# Patient Record
Sex: Male | Born: 1945
Health system: Southern US, Community
[De-identification: ages and names within clinical notes are randomized; demographics above are authoritative.]

---

## 2003-09-07 ENCOUNTER — Encounter: Admission: RE | Admit: 2003-09-07 | Discharge: 2003-09-07 | Payer: Self-pay

## 2003-10-04 ENCOUNTER — Encounter: Admission: RE | Admit: 2003-10-04 | Discharge: 2003-10-04 | Payer: Self-pay

## 2003-10-24 ENCOUNTER — Encounter: Admission: RE | Admit: 2003-10-24 | Discharge: 2003-10-24 | Payer: Self-pay

## 2003-11-11 ENCOUNTER — Encounter: Admission: RE | Admit: 2003-11-11 | Discharge: 2003-11-11 | Payer: Self-pay

## 2004-11-01 ENCOUNTER — Encounter: Admission: RE | Admit: 2004-11-01 | Discharge: 2004-11-01 | Payer: Self-pay | Admitting: Internal Medicine

## 2004-12-21 ENCOUNTER — Encounter: Admission: RE | Admit: 2004-12-21 | Discharge: 2004-12-21 | Payer: Self-pay | Admitting: Internal Medicine

## 2006-05-16 ENCOUNTER — Encounter (INDEPENDENT_AMBULATORY_CARE_PROVIDER_SITE_OTHER): Payer: Self-pay | Admitting: *Deleted

## 2006-05-16 ENCOUNTER — Ambulatory Visit (HOSPITAL_COMMUNITY): Admission: RE | Admit: 2006-05-16 | Discharge: 2006-05-16 | Payer: Self-pay | Admitting: *Deleted

## 2007-02-13 ENCOUNTER — Encounter: Admission: RE | Admit: 2007-02-13 | Discharge: 2007-02-13 | Payer: Self-pay | Admitting: Internal Medicine

## 2010-10-19 NOTE — Op Note (Signed)
NAMEAROLDO, GALLI NO.:  [id number]   MEDICAL RECORD NO.:  [id number]          PATIENT TYPE:  AMB   LOCATION:  ENDO                         FACILITY:  [facility]   PHYSICIAN:  Georgiana Spinner, M.D.    DATE OF BIRTH:  [birth year]   DATE OF PROCEDURE:  DATE OF DISCHARGE:                               OPERATIVE REPORT   PROCEDURE:  Upper endoscopy.  The patient has GERD.   ANESTHESIA:  Fentanyl 75 mcg, Versed 8 mg.   PROCEDURE:  With the patient mildly sedated in the left lateral  decubitus position, the Pentax videoscopic endoscope was inserted in the  mouth and passed under direct vision through the esophagus which  appeared normal.  There was no evidence of Barrett's esophagus.  We  entered into the stomach.  The fundus, body, antrum appeared normal,  except for the prepyloric area which appeared mildly erythematous and  was photographed and biopsied.  We entered into the duodenal bulb and  second portion of the duodenum.  Both appeared normal.  From this point,  the endoscope was slowly withdrawn, taking circumferential views of  duodenal mucosa until the endoscope had been pulled back into the  stomach and placed in retroflexion, viewing the stomach from below.  The  endoscope was straightened and withdrawn, taking circumferential views  of the remaining gastric and esophageal mucosa.  The patient's vital  signs and pulse oximeter remained stable.  The patient tolerated the  procedure well without apparent complications.   FINDINGS:  Erythema of gastric antrum, biopsied.  Await biopsy report.  The patient will call me for results and follow up with me as an  outpatient.  Proceed to colonoscopy.           ______________________________  Georgiana Spinner, M.D.     GMO/MEDQ  D:  05/16/2006  T:  05/16/2006  Job:  [id number]

## 2010-10-19 NOTE — Op Note (Signed)
John Ali, CRIGER NO.:  [id number]   MEDICAL RECORD NO.:  [id number]          PATIENT TYPE:  AMB   LOCATION:  ENDO                         FACILITY:  [facility]   PHYSICIAN:  Georgiana Spinner, M.D.    DATE OF BIRTH:  February 06, 1946   DATE OF PROCEDURE:  05/16/2006  DATE OF DISCHARGE:                               OPERATIVE REPORT   PROCEDURE:  Colonoscopy.   INDICATIONS:  Colon cancer screening.   ANESTHESIA:  Fentanyl 50 mcg, Versed 2 mg   PROCEDURE:  With the patient mildly sedated in the left lateral  decubitus position the Pentax videoscopic colonoscope was inserted into  the rectum after a rectal exam was attempted, passed under direct vision  to the cecum, identified by ileocecal valve and appendiceal orifice both  which were photographed.  From this point the colonoscope was slowly  withdrawn, taking circumferential views of colonic mucosa, stopping only  in the rectum which appeared normal on direct, showed hemorrhoids on  retroflexed view.  The endoscope was straightened and withdrawn.  The  patient's vital signs, pulse oximeter remained stable.  The patient  tolerated procedure well without apparent complications.   FINDINGS:  Internal hemorrhoids, otherwise unremarkable examination.   PLAN:  Consider repeat examination in 5-10 years.           ______________________________  Georgiana Spinner, M.D.     GMO/MEDQ  D:  05/16/2006  T:  05/16/2006  Job:  [id number]

## 2014-06-16 DIAGNOSIS — F5101 Primary insomnia: Secondary | ICD-10-CM | POA: Diagnosis not present

## 2014-06-16 DIAGNOSIS — E1165 Type 2 diabetes mellitus with hyperglycemia: Secondary | ICD-10-CM | POA: Diagnosis not present

## 2014-06-16 DIAGNOSIS — E039 Hypothyroidism, unspecified: Secondary | ICD-10-CM | POA: Diagnosis not present

## 2014-06-16 DIAGNOSIS — I10 Essential (primary) hypertension: Secondary | ICD-10-CM | POA: Diagnosis not present

## 2014-09-20 DIAGNOSIS — D72829 Elevated white blood cell count, unspecified: Secondary | ICD-10-CM | POA: Diagnosis not present

## 2014-09-20 DIAGNOSIS — E119 Type 2 diabetes mellitus without complications: Secondary | ICD-10-CM | POA: Diagnosis not present

## 2014-09-20 DIAGNOSIS — Z125 Encounter for screening for malignant neoplasm of prostate: Secondary | ICD-10-CM | POA: Diagnosis not present

## 2014-09-26 DIAGNOSIS — E1165 Type 2 diabetes mellitus with hyperglycemia: Secondary | ICD-10-CM | POA: Diagnosis not present

## 2014-09-26 DIAGNOSIS — E039 Hypothyroidism, unspecified: Secondary | ICD-10-CM | POA: Diagnosis not present

## 2014-09-26 DIAGNOSIS — Z23 Encounter for immunization: Secondary | ICD-10-CM | POA: Diagnosis not present

## 2014-09-26 DIAGNOSIS — Z0000 Encounter for general adult medical examination without abnormal findings: Secondary | ICD-10-CM | POA: Diagnosis not present

## 2014-09-26 DIAGNOSIS — E780 Pure hypercholesterolemia: Secondary | ICD-10-CM | POA: Diagnosis not present

## 2015-01-18 DIAGNOSIS — Z794 Long term (current) use of insulin: Secondary | ICD-10-CM | POA: Diagnosis not present

## 2015-01-18 DIAGNOSIS — E1165 Type 2 diabetes mellitus with hyperglycemia: Secondary | ICD-10-CM | POA: Diagnosis not present

## 2015-01-18 DIAGNOSIS — I10 Essential (primary) hypertension: Secondary | ICD-10-CM | POA: Diagnosis not present

## 2015-03-08 DIAGNOSIS — I10 Essential (primary) hypertension: Secondary | ICD-10-CM | POA: Diagnosis not present

## 2015-03-08 DIAGNOSIS — E1165 Type 2 diabetes mellitus with hyperglycemia: Secondary | ICD-10-CM | POA: Diagnosis not present

## 2015-03-17 DIAGNOSIS — E1165 Type 2 diabetes mellitus with hyperglycemia: Secondary | ICD-10-CM | POA: Diagnosis not present

## 2015-03-17 DIAGNOSIS — E782 Mixed hyperlipidemia: Secondary | ICD-10-CM | POA: Diagnosis not present

## 2015-03-17 DIAGNOSIS — Z794 Long term (current) use of insulin: Secondary | ICD-10-CM | POA: Diagnosis not present

## 2015-03-17 DIAGNOSIS — I10 Essential (primary) hypertension: Secondary | ICD-10-CM | POA: Diagnosis not present

## 2015-03-17 DIAGNOSIS — Z23 Encounter for immunization: Secondary | ICD-10-CM | POA: Diagnosis not present

## 2015-04-21 DIAGNOSIS — M5136 Other intervertebral disc degeneration, lumbar region: Secondary | ICD-10-CM | POA: Diagnosis not present

## 2015-04-21 DIAGNOSIS — M545 Low back pain: Secondary | ICD-10-CM | POA: Diagnosis not present

## 2015-04-21 DIAGNOSIS — R52 Pain, unspecified: Secondary | ICD-10-CM | POA: Diagnosis not present

## 2015-04-21 DIAGNOSIS — R51 Headache: Secondary | ICD-10-CM | POA: Diagnosis not present

## 2015-04-21 DIAGNOSIS — M62838 Other muscle spasm: Secondary | ICD-10-CM | POA: Diagnosis not present

## 2015-04-21 DIAGNOSIS — M47817 Spondylosis without myelopathy or radiculopathy, lumbosacral region: Secondary | ICD-10-CM | POA: Diagnosis not present

## 2015-04-21 DIAGNOSIS — M5137 Other intervertebral disc degeneration, lumbosacral region: Secondary | ICD-10-CM | POA: Diagnosis not present

## 2015-04-25 DIAGNOSIS — E1165 Type 2 diabetes mellitus with hyperglycemia: Secondary | ICD-10-CM | POA: Diagnosis not present

## 2015-04-25 DIAGNOSIS — E782 Mixed hyperlipidemia: Secondary | ICD-10-CM | POA: Diagnosis not present

## 2015-04-25 DIAGNOSIS — Z794 Long term (current) use of insulin: Secondary | ICD-10-CM | POA: Diagnosis not present

## 2015-06-07 DIAGNOSIS — J209 Acute bronchitis, unspecified: Secondary | ICD-10-CM | POA: Diagnosis not present

## 2015-06-07 DIAGNOSIS — J0110 Acute frontal sinusitis, unspecified: Secondary | ICD-10-CM | POA: Diagnosis not present

## 2015-06-07 DIAGNOSIS — I10 Essential (primary) hypertension: Secondary | ICD-10-CM | POA: Diagnosis not present

## 2015-06-27 DIAGNOSIS — E782 Mixed hyperlipidemia: Secondary | ICD-10-CM | POA: Diagnosis not present

## 2015-06-27 DIAGNOSIS — E1165 Type 2 diabetes mellitus with hyperglycemia: Secondary | ICD-10-CM | POA: Diagnosis not present

## 2015-06-27 DIAGNOSIS — Z794 Long term (current) use of insulin: Secondary | ICD-10-CM | POA: Diagnosis not present

## 2015-07-04 DIAGNOSIS — E1165 Type 2 diabetes mellitus with hyperglycemia: Secondary | ICD-10-CM | POA: Diagnosis not present

## 2015-07-04 DIAGNOSIS — I10 Essential (primary) hypertension: Secondary | ICD-10-CM | POA: Diagnosis not present

## 2015-07-04 DIAGNOSIS — E782 Mixed hyperlipidemia: Secondary | ICD-10-CM | POA: Diagnosis not present

## 2015-07-04 DIAGNOSIS — J069 Acute upper respiratory infection, unspecified: Secondary | ICD-10-CM | POA: Diagnosis not present

## 2015-07-10 DIAGNOSIS — M5416 Radiculopathy, lumbar region: Secondary | ICD-10-CM | POA: Diagnosis not present

## 2015-07-10 DIAGNOSIS — M545 Low back pain: Secondary | ICD-10-CM | POA: Diagnosis not present

## 2015-07-10 DIAGNOSIS — M1288 Other specific arthropathies, not elsewhere classified, other specified site: Secondary | ICD-10-CM | POA: Diagnosis not present

## 2015-07-24 DIAGNOSIS — M1288 Other specific arthropathies, not elsewhere classified, other specified site: Secondary | ICD-10-CM | POA: Diagnosis not present

## 2015-07-24 DIAGNOSIS — M5440 Lumbago with sciatica, unspecified side: Secondary | ICD-10-CM | POA: Diagnosis not present

## 2015-07-24 DIAGNOSIS — M5416 Radiculopathy, lumbar region: Secondary | ICD-10-CM | POA: Diagnosis not present

## 2015-07-29 DIAGNOSIS — R1110 Vomiting, unspecified: Secondary | ICD-10-CM | POA: Diagnosis not present

## 2015-07-29 DIAGNOSIS — R072 Precordial pain: Secondary | ICD-10-CM | POA: Diagnosis not present

## 2015-07-29 DIAGNOSIS — R079 Chest pain, unspecified: Secondary | ICD-10-CM | POA: Diagnosis not present

## 2015-07-30 DIAGNOSIS — R072 Precordial pain: Secondary | ICD-10-CM | POA: Diagnosis not present

## 2015-07-30 DIAGNOSIS — I10 Essential (primary) hypertension: Secondary | ICD-10-CM | POA: Diagnosis not present

## 2015-07-30 DIAGNOSIS — R079 Chest pain, unspecified: Secondary | ICD-10-CM | POA: Diagnosis not present

## 2015-07-30 DIAGNOSIS — E1165 Type 2 diabetes mellitus with hyperglycemia: Secondary | ICD-10-CM | POA: Diagnosis not present

## 2015-07-31 DIAGNOSIS — I10 Essential (primary) hypertension: Secondary | ICD-10-CM | POA: Diagnosis not present

## 2015-07-31 DIAGNOSIS — R079 Chest pain, unspecified: Secondary | ICD-10-CM | POA: Diagnosis not present

## 2015-07-31 DIAGNOSIS — E119 Type 2 diabetes mellitus without complications: Secondary | ICD-10-CM | POA: Diagnosis not present

## 2015-07-31 DIAGNOSIS — R911 Solitary pulmonary nodule: Secondary | ICD-10-CM | POA: Diagnosis not present

## 2015-07-31 DIAGNOSIS — R072 Precordial pain: Secondary | ICD-10-CM | POA: Diagnosis not present

## 2015-08-01 DIAGNOSIS — I10 Essential (primary) hypertension: Secondary | ICD-10-CM | POA: Diagnosis not present

## 2015-08-01 DIAGNOSIS — I25118 Atherosclerotic heart disease of native coronary artery with other forms of angina pectoris: Secondary | ICD-10-CM | POA: Diagnosis not present

## 2015-08-01 DIAGNOSIS — E119 Type 2 diabetes mellitus without complications: Secondary | ICD-10-CM | POA: Diagnosis not present

## 2015-08-01 DIAGNOSIS — I501 Left ventricular failure: Secondary | ICD-10-CM | POA: Diagnosis not present

## 2015-08-01 DIAGNOSIS — I6523 Occlusion and stenosis of bilateral carotid arteries: Secondary | ICD-10-CM | POA: Diagnosis not present

## 2015-08-01 DIAGNOSIS — R072 Precordial pain: Secondary | ICD-10-CM | POA: Diagnosis not present

## 2015-08-01 DIAGNOSIS — R079 Chest pain, unspecified: Secondary | ICD-10-CM | POA: Diagnosis not present

## 2015-08-01 DIAGNOSIS — R570 Cardiogenic shock: Secondary | ICD-10-CM | POA: Diagnosis not present

## 2015-08-01 DIAGNOSIS — I25119 Atherosclerotic heart disease of native coronary artery with unspecified angina pectoris: Secondary | ICD-10-CM | POA: Diagnosis not present

## 2015-08-02 DIAGNOSIS — I517 Cardiomegaly: Secondary | ICD-10-CM | POA: Diagnosis not present

## 2015-08-02 DIAGNOSIS — I491 Atrial premature depolarization: Secondary | ICD-10-CM | POA: Diagnosis not present

## 2015-08-02 DIAGNOSIS — R072 Precordial pain: Secondary | ICD-10-CM | POA: Diagnosis not present

## 2015-08-03 DIAGNOSIS — I4581 Long QT syndrome: Secondary | ICD-10-CM | POA: Diagnosis not present

## 2015-08-04 DIAGNOSIS — I447 Left bundle-branch block, unspecified: Secondary | ICD-10-CM | POA: Diagnosis not present

## 2015-08-05 DIAGNOSIS — R072 Precordial pain: Secondary | ICD-10-CM | POA: Diagnosis not present

## 2015-08-06 DIAGNOSIS — R072 Precordial pain: Secondary | ICD-10-CM | POA: Diagnosis not present

## 2015-08-07 DIAGNOSIS — R072 Precordial pain: Secondary | ICD-10-CM | POA: Diagnosis not present

## 2016-04-09 DIAGNOSIS — I2510 Atherosclerotic heart disease of native coronary artery without angina pectoris: Secondary | ICD-10-CM | POA: Diagnosis not present

## 2016-04-09 DIAGNOSIS — E119 Type 2 diabetes mellitus without complications: Secondary | ICD-10-CM | POA: Diagnosis not present

## 2016-04-09 DIAGNOSIS — E1169 Type 2 diabetes mellitus with other specified complication: Secondary | ICD-10-CM | POA: Diagnosis not present

## 2016-04-09 DIAGNOSIS — I252 Old myocardial infarction: Secondary | ICD-10-CM | POA: Diagnosis not present

## 2016-04-09 DIAGNOSIS — I10 Essential (primary) hypertension: Secondary | ICD-10-CM | POA: Diagnosis not present

## 2016-04-24 DIAGNOSIS — R072 Precordial pain: Secondary | ICD-10-CM | POA: Diagnosis not present

## 2016-04-24 DIAGNOSIS — I2510 Atherosclerotic heart disease of native coronary artery without angina pectoris: Secondary | ICD-10-CM | POA: Diagnosis not present

## 2016-04-30 DIAGNOSIS — I252 Old myocardial infarction: Secondary | ICD-10-CM | POA: Diagnosis not present

## 2016-04-30 DIAGNOSIS — I2510 Atherosclerotic heart disease of native coronary artery without angina pectoris: Secondary | ICD-10-CM | POA: Diagnosis not present

## 2016-04-30 DIAGNOSIS — E1169 Type 2 diabetes mellitus with other specified complication: Secondary | ICD-10-CM | POA: Diagnosis not present

## 2016-04-30 DIAGNOSIS — I209 Angina pectoris, unspecified: Secondary | ICD-10-CM | POA: Diagnosis not present

## 2016-04-30 DIAGNOSIS — I10 Essential (primary) hypertension: Secondary | ICD-10-CM | POA: Diagnosis not present

## 2016-06-21 DIAGNOSIS — E1165 Type 2 diabetes mellitus with hyperglycemia: Secondary | ICD-10-CM | POA: Diagnosis not present

## 2016-06-21 DIAGNOSIS — I2510 Atherosclerotic heart disease of native coronary artery without angina pectoris: Secondary | ICD-10-CM | POA: Diagnosis not present

## 2016-06-21 DIAGNOSIS — N400 Enlarged prostate without lower urinary tract symptoms: Secondary | ICD-10-CM | POA: Diagnosis not present

## 2016-06-27 DIAGNOSIS — E039 Hypothyroidism, unspecified: Secondary | ICD-10-CM | POA: Diagnosis not present

## 2016-06-27 DIAGNOSIS — I2510 Atherosclerotic heart disease of native coronary artery without angina pectoris: Secondary | ICD-10-CM | POA: Diagnosis not present

## 2016-06-27 DIAGNOSIS — E782 Mixed hyperlipidemia: Secondary | ICD-10-CM | POA: Diagnosis not present

## 2016-06-27 DIAGNOSIS — E1165 Type 2 diabetes mellitus with hyperglycemia: Secondary | ICD-10-CM | POA: Diagnosis not present

## 2016-06-27 DIAGNOSIS — Z23 Encounter for immunization: Secondary | ICD-10-CM | POA: Diagnosis not present

## 2016-10-03 DIAGNOSIS — I2510 Atherosclerotic heart disease of native coronary artery without angina pectoris: Secondary | ICD-10-CM | POA: Diagnosis not present

## 2016-10-03 DIAGNOSIS — E1165 Type 2 diabetes mellitus with hyperglycemia: Secondary | ICD-10-CM | POA: Diagnosis not present

## 2016-10-10 DIAGNOSIS — I2510 Atherosclerotic heart disease of native coronary artery without angina pectoris: Secondary | ICD-10-CM | POA: Diagnosis not present

## 2016-10-10 DIAGNOSIS — E782 Mixed hyperlipidemia: Secondary | ICD-10-CM | POA: Diagnosis not present

## 2016-10-10 DIAGNOSIS — E1165 Type 2 diabetes mellitus with hyperglycemia: Secondary | ICD-10-CM | POA: Diagnosis not present

## 2016-10-10 DIAGNOSIS — E039 Hypothyroidism, unspecified: Secondary | ICD-10-CM | POA: Diagnosis not present

## 2016-10-15 DIAGNOSIS — E119 Type 2 diabetes mellitus without complications: Secondary | ICD-10-CM | POA: Diagnosis not present

## 2016-10-15 DIAGNOSIS — M7989 Other specified soft tissue disorders: Secondary | ICD-10-CM | POA: Diagnosis not present

## 2016-10-15 DIAGNOSIS — G4733 Obstructive sleep apnea (adult) (pediatric): Secondary | ICD-10-CM | POA: Diagnosis not present

## 2016-10-15 DIAGNOSIS — Z01818 Encounter for other preprocedural examination: Secondary | ICD-10-CM | POA: Diagnosis not present

## 2016-10-15 DIAGNOSIS — I2510 Atherosclerotic heart disease of native coronary artery without angina pectoris: Secondary | ICD-10-CM | POA: Diagnosis not present

## 2016-10-15 DIAGNOSIS — E785 Hyperlipidemia, unspecified: Secondary | ICD-10-CM | POA: Diagnosis not present

## 2016-10-15 DIAGNOSIS — I10 Essential (primary) hypertension: Secondary | ICD-10-CM | POA: Diagnosis not present

## 2016-10-15 DIAGNOSIS — I25119 Atherosclerotic heart disease of native coronary artery with unspecified angina pectoris: Secondary | ICD-10-CM | POA: Diagnosis not present

## 2016-10-15 DIAGNOSIS — I252 Old myocardial infarction: Secondary | ICD-10-CM | POA: Diagnosis not present

## 2016-10-15 DIAGNOSIS — I209 Angina pectoris, unspecified: Secondary | ICD-10-CM | POA: Diagnosis not present

## 2016-10-16 DIAGNOSIS — I2510 Atherosclerotic heart disease of native coronary artery without angina pectoris: Secondary | ICD-10-CM | POA: Diagnosis not present

## 2016-10-16 DIAGNOSIS — M7989 Other specified soft tissue disorders: Secondary | ICD-10-CM | POA: Diagnosis not present

## 2016-10-23 DIAGNOSIS — I10 Essential (primary) hypertension: Secondary | ICD-10-CM | POA: Diagnosis not present

## 2016-10-23 DIAGNOSIS — R9439 Abnormal result of other cardiovascular function study: Secondary | ICD-10-CM | POA: Diagnosis not present

## 2016-10-23 DIAGNOSIS — G8929 Other chronic pain: Secondary | ICD-10-CM | POA: Diagnosis not present

## 2016-10-23 DIAGNOSIS — E785 Hyperlipidemia, unspecified: Secondary | ICD-10-CM | POA: Diagnosis not present

## 2016-10-23 DIAGNOSIS — R911 Solitary pulmonary nodule: Secondary | ICD-10-CM | POA: Diagnosis not present

## 2016-10-23 DIAGNOSIS — Z7984 Long term (current) use of oral hypoglycemic drugs: Secondary | ICD-10-CM | POA: Diagnosis not present

## 2016-10-23 DIAGNOSIS — E119 Type 2 diabetes mellitus without complications: Secondary | ICD-10-CM | POA: Diagnosis not present

## 2016-10-23 DIAGNOSIS — I2510 Atherosclerotic heart disease of native coronary artery without angina pectoris: Secondary | ICD-10-CM | POA: Diagnosis not present

## 2016-10-23 DIAGNOSIS — M545 Low back pain: Secondary | ICD-10-CM | POA: Diagnosis not present

## 2016-10-23 DIAGNOSIS — R072 Precordial pain: Secondary | ICD-10-CM | POA: Diagnosis not present

## 2016-10-23 DIAGNOSIS — Z9889 Other specified postprocedural states: Secondary | ICD-10-CM | POA: Diagnosis not present

## 2016-10-23 DIAGNOSIS — G4733 Obstructive sleep apnea (adult) (pediatric): Secondary | ICD-10-CM | POA: Diagnosis not present

## 2016-10-23 DIAGNOSIS — R600 Localized edema: Secondary | ICD-10-CM | POA: Diagnosis not present

## 2016-10-23 DIAGNOSIS — Z955 Presence of coronary angioplasty implant and graft: Secondary | ICD-10-CM | POA: Diagnosis not present

## 2016-10-23 DIAGNOSIS — Z7982 Long term (current) use of aspirin: Secondary | ICD-10-CM | POA: Diagnosis not present

## 2016-10-23 DIAGNOSIS — Z981 Arthrodesis status: Secondary | ICD-10-CM | POA: Diagnosis not present

## 2016-10-23 DIAGNOSIS — Z79899 Other long term (current) drug therapy: Secondary | ICD-10-CM | POA: Diagnosis not present

## 2016-10-23 DIAGNOSIS — I252 Old myocardial infarction: Secondary | ICD-10-CM | POA: Diagnosis not present

## 2017-01-10 DIAGNOSIS — R911 Solitary pulmonary nodule: Secondary | ICD-10-CM | POA: Diagnosis not present

## 2017-01-10 DIAGNOSIS — I252 Old myocardial infarction: Secondary | ICD-10-CM | POA: Diagnosis not present

## 2017-01-10 DIAGNOSIS — I2510 Atherosclerotic heart disease of native coronary artery without angina pectoris: Secondary | ICD-10-CM | POA: Diagnosis not present

## 2017-01-10 DIAGNOSIS — E119 Type 2 diabetes mellitus without complications: Secondary | ICD-10-CM | POA: Diagnosis not present

## 2017-01-10 DIAGNOSIS — I10 Essential (primary) hypertension: Secondary | ICD-10-CM | POA: Diagnosis not present

## 2017-02-14 DIAGNOSIS — E039 Hypothyroidism, unspecified: Secondary | ICD-10-CM | POA: Diagnosis not present

## 2017-02-14 DIAGNOSIS — I10 Essential (primary) hypertension: Secondary | ICD-10-CM | POA: Diagnosis not present

## 2017-02-14 DIAGNOSIS — Z125 Encounter for screening for malignant neoplasm of prostate: Secondary | ICD-10-CM | POA: Diagnosis not present

## 2017-02-14 DIAGNOSIS — E782 Mixed hyperlipidemia: Secondary | ICD-10-CM | POA: Diagnosis not present

## 2017-02-14 DIAGNOSIS — E1165 Type 2 diabetes mellitus with hyperglycemia: Secondary | ICD-10-CM | POA: Diagnosis not present

## 2017-02-14 DIAGNOSIS — Z0000 Encounter for general adult medical examination without abnormal findings: Secondary | ICD-10-CM | POA: Diagnosis not present

## 2017-02-21 DIAGNOSIS — I2510 Atherosclerotic heart disease of native coronary artery without angina pectoris: Secondary | ICD-10-CM | POA: Diagnosis not present

## 2017-02-21 DIAGNOSIS — E782 Mixed hyperlipidemia: Secondary | ICD-10-CM | POA: Diagnosis not present

## 2017-02-21 DIAGNOSIS — E039 Hypothyroidism, unspecified: Secondary | ICD-10-CM | POA: Diagnosis not present

## 2017-02-21 DIAGNOSIS — Z23 Encounter for immunization: Secondary | ICD-10-CM | POA: Diagnosis not present

## 2017-02-21 DIAGNOSIS — E1165 Type 2 diabetes mellitus with hyperglycemia: Secondary | ICD-10-CM | POA: Diagnosis not present

## 2017-02-21 DIAGNOSIS — M545 Low back pain: Secondary | ICD-10-CM | POA: Diagnosis not present

## 2017-04-04 ENCOUNTER — Other Ambulatory Visit: Payer: Self-pay | Admitting: Emergency Medicine

## 2017-04-04 ENCOUNTER — Ambulatory Visit (INDEPENDENT_AMBULATORY_CARE_PROVIDER_SITE_OTHER): Payer: Medicare Other

## 2017-04-04 DIAGNOSIS — M25522 Pain in left elbow: Secondary | ICD-10-CM

## 2017-04-04 DIAGNOSIS — R52 Pain, unspecified: Secondary | ICD-10-CM

## 2017-04-04 DIAGNOSIS — X58XXXA Exposure to other specified factors, initial encounter: Secondary | ICD-10-CM

## 2017-04-04 DIAGNOSIS — M7989 Other specified soft tissue disorders: Secondary | ICD-10-CM

## 2017-04-04 DIAGNOSIS — S89302A Unspecified physeal fracture of lower end of left fibula, initial encounter for closed fracture: Secondary | ICD-10-CM

## 2017-04-08 ENCOUNTER — Ambulatory Visit (INDEPENDENT_AMBULATORY_CARE_PROVIDER_SITE_OTHER): Payer: Medicare Other

## 2017-04-08 ENCOUNTER — Ambulatory Visit (INDEPENDENT_AMBULATORY_CARE_PROVIDER_SITE_OTHER): Payer: Medicare Other | Admitting: Family Medicine

## 2017-04-08 DIAGNOSIS — S82892A Other fracture of left lower leg, initial encounter for closed fracture: Secondary | ICD-10-CM

## 2017-04-08 DIAGNOSIS — S82832D Other fracture of upper and lower end of left fibula, subsequent encounter for closed fracture with routine healing: Secondary | ICD-10-CM

## 2017-04-08 DIAGNOSIS — X58XXXD Exposure to other specified factors, subsequent encounter: Secondary | ICD-10-CM

## 2017-04-08 MED ORDER — OXYCODONE-ACETAMINOPHEN 5-325 MG PO TABS: 1.0000 | ORAL_TABLET | Freq: Four times a day (QID) | ORAL | 0 refills | Status: AC | PRN

## 2017-04-08 MED ORDER — AMBULATORY NON FORMULARY MEDICATION: 0 refills | Status: AC

## 2017-04-08 NOTE — Patient Instructions (Addendum)
Thank you for coming in today. Recheck in 2 weeks.  Return sooner if needed.  Keep the weight off the leg if able.  Sleep in the boot.    Ankle Fracture A fracture is a break in a bone. A cast or splint may be used to protect the ankle and heal the break. Sometimes, surgery is needed. Follow these instructions at home:  Use crutches as told by your doctor. It is very important that you use your crutches correctly.  Do not put weight or pressure on the injured ankle until told by your doctor.  Keep your ankle raised (elevated) when sitting or lying down.  Apply ice to the ankle: ? Put ice in a plastic bag. ? Place a towel between your cast and the bag. ? Leave the ice on for 20 minutes, 2-3 times a day.  If you have a plaster or fiberglass cast: ? Do not try to scratch under the cast with any objects. ? Check the skin around the cast every day. You may put lotion on red or sore areas. ? Keep your cast dry and clean.  If you have a plaster splint: ? Wear the splint as told by your doctor. ? You can loosen the elastic around the splint if your toes get numb, tingle, or turn cold or blue.  Do not put pressure on any part of your cast or splint. It may break. Rest your plaster splint or cast only on a pillow the first 24 hours until it is fully hardened.  Cover your cast or splint with a plastic bag during showers.  Do not lower your cast or splint into water.  Take medicine as told by your doctor.  Do not drive until your doctor says it is safe.  Follow-up with your doctor as told. It is very important that you go to your follow-up visits. Contact a doctor if: The swelling and discomfort gets worse. Get help right away if:  Your splint or cast breaks.  You continue to have very bad pain.  You have new pain or swelling after your splint or cast was put on.  Your skin or toes below the injured ankle: ? Turn blue or gray. ? Feel cold, numb, or you cannot feel  them.  There is a bad smell or yellowish white fluid (pus) coming from under the splint or cast. This information is not intended to replace advice given to you by your health care provider. Make sure you discuss any questions you have with your health care provider. Document Released: 03/17/2009 Document Revised: 10/26/2015 Document Reviewed: 12/17/2012 Elsevier Interactive Patient Education  2017 [employer].

## 2017-04-08 NOTE — Progress Notes (Signed)
Subjective:    I'm seeing this patient as a consultation for:  Dr. Burnett Harry  CC: Non-displaced left distal fibular fracture  HPI: Patient with history of left distal leg crush injury on 11/1 after work-place accident. Patient seen initially by Dr. Burnett Harry on 11/2 for severe left ankle and knee pain. Xrays notable for left nondisplaced distal fibular fracture. No other fractures identified at that time.  Imaging summarized as: nondisplaced fracture of lateral malleolus of left fibula. The mortiseis was noted to be intact. No other acute changes were noted on imaging.   The patient was placed initially in a soft, boot walking cast. Patient has since been placing weight on foot. Patient also prescribed vicodin PRN q6h for pain.  Today, patient endorses significant pain in knee as well as lateral ankle. Pain is worse with walking. Patient has been attempting to walk with fracture, but significantly limited due to pain. His knee pain is located at medial joint line and quadriceps tendon. Patient states he has lateral knee pain with weight bearing. Ankle pain located over fracture site worse with weight baring. Pain radiates up lateral leg to knee with walking. Patient does state that knee is significantly swollen today. Notes no erythema or skin changes. He has not had follow-up imaging of ankle.  Past medical history, Surgical history, Family history not pertinant except as noted below, Social history, Allergies, and medications have been entered into the medical record, reviewed, and no changes needed.   Review of Systems: No headache, visual changes, nausea, vomiting, diarrhea, constipation, dizziness, abdominal pain, skin rash, fevers, chills, night sweats, weight loss, swollen lymph nodes, body aches, joint swelling, muscle aches, chest pain, shortness of breath, mood changes, visual or auditory hallucinations.   Objective:    Vitals:   04/08/17 1047  BP: (!) 147/77  Pulse: 69   General:  Well Developed, well nourished, and in no acute distress.  Neuro/Psych: Alert and oriented x3, extra-ocular muscles intact, able to move all 4 extremities, sensation grossly intact. Skin: Warm and dry, no rashes noted.  Respiratory: Not using accessory muscles, speaking in full sentences, trachea midline.  Cardiovascular: Pulses palpable, no extremity edema. Abdomen: Does not appear distended. MSK:  L. Ankle: Minimally swollen with no overlying erythema Significantly tender to palpation over lateral aspect Ankle grossly intact with preserved sensation Plantar/dorsiflexion intact  Pulses and capillary refill and sensation are intact distally.   L. Knee: Minimally swollen with no overlying erythema Tenderness along medial joint line and quadriceps tendon Appropriate range of motion on passive/active flexion though limited due to LE pain  Left Ankle Xray 11/6: Left ankle xray shows nondisplaced distal fibular fracture.  No concern for displacement since initial imaging.  The mortiseis was noted to be intact. Awaiting further radiologist review.  No results found for this or any previous visit (from the past 24 hour(s)). No results found.  Impression and Recommendations:    Assessment and Plan: 71 y.o. male with nondisplaced distal fibular fracture. Follow-up Xray imaging today shows unchanged fracture of distal fibula without displacement.   The patient does not require surgical fixation or orthopedic referral at this time as fracture is nondisplaced. We discussed option of soft boot and hard casting, and patient elected for management with a soft boot. This is reasonable given xray findings. Patient was counseled to avoid placing weight on left ankle for at least 2-4 weeks. Boot provided on initial presentation was too large, so provided new boot today. Placed order for walker today so  patient can minimize weight baring.   Patient also continues to struggle with pain control.  Prescribed 5-day course of oxycodone to be taken on as needed basis.   Patient will not be able to work for next 2-4 weeks, and will have to minimize weight baring after that point for an additional 4-8 weeks.  He will follow up in clinic for follow up and repeat left ankle xray in 2 weeks.   Orders Placed This Encounter  Procedures  . DG Ankle Complete Left    Standing Status:   Future    Number of Occurrences:   1    Standing Expiration Date:   06/08/2018    Order Specific Question:   Reason for Exam (SYMPTOM  OR DIAGNOSIS REQUIRED)    Answer:   eval fracture. Displacing?    Order Specific Question:   Preferred imaging location?    Answer:   [facility]    Order Specific Question:   Radiology Contrast Protocol - do NOT remove file path    Answer:   [email or web address]   Meds ordered this encounter  Medications  . RANEXA 500 MG 12 hr tablet  . ranolazine (RANEXA) 500 MG 12 hr tablet    Sig: Take 500 mg by mouth.  . prasugrel (EFFIENT) 10 MG TABS tablet  . prasugrel (EFFIENT) 10 MG TABS tablet    Sig: TAKE ONE TABLET BY MOUTH ONCE DAILY  . metFORMIN (GLUCOPHAGE) 1000 MG tablet  . metoprolol tartrate (LOPRESSOR) 25 MG tablet    Sig: TAKE 1 TABLET BY MOUTH TWICE DAILY  . LEVEMIR FLEXTOUCH 100 UNIT/ML Pen  . atorvastatin (LIPITOR) 80 MG tablet  . AMBULATORY NON FORMULARY MEDICATION    Sig: Standard Walker for use with ankle fracture.  S82.892A    Dispense:  1 each    Refill:  0  . oxyCODONE-acetaminophen (PERCOCET/ROXICET) 5-325 MG tablet    Sig: Take 1 tablet every 6 (six) hours as needed by mouth.    Dispense:  15 tablet    Refill:  0    Discussed warning signs or symptoms. Please see discharge instructions. Patient expresses understanding.

## 2017-04-22 ENCOUNTER — Encounter: Payer: Self-pay | Admitting: Family Medicine

## 2017-04-22 ENCOUNTER — Ambulatory Visit (INDEPENDENT_AMBULATORY_CARE_PROVIDER_SITE_OTHER): Payer: Medicare Other

## 2017-04-22 ENCOUNTER — Ambulatory Visit (INDEPENDENT_AMBULATORY_CARE_PROVIDER_SITE_OTHER): Payer: Medicare Other | Admitting: Family Medicine

## 2017-04-22 VITALS — BP 155/58 | HR 60 | Temp 98.2°F | Resp 16 | Wt 271.1 lb

## 2017-04-22 DIAGNOSIS — S82892A Other fracture of left lower leg, initial encounter for closed fracture: Secondary | ICD-10-CM

## 2017-04-22 DIAGNOSIS — X58XXXD Exposure to other specified factors, subsequent encounter: Secondary | ICD-10-CM

## 2017-04-22 DIAGNOSIS — S82832A Other fracture of upper and lower end of left fibula, initial encounter for closed fracture: Secondary | ICD-10-CM | POA: Diagnosis not present

## 2017-04-22 NOTE — Patient Instructions (Signed)
Thank you for coming in today. Recheck in 2 weeks.  Continue non-weight bearing  Get a job description and list of duties sent to me so I can figure out if you can return to work.  Continue the cast boot.

## 2017-04-22 NOTE — Progress Notes (Signed)
John Ali is a 71 y.o. male who presents to [facility] today for follow up of left nondisplaced distal fibular fracture.  Patient with history of left distal leg crush injury on 11/1 after work-place accident. Patient seen initially by Dr. Burnett Harry on 11/2 for severe left ankle and knee pain. Xrays notable for left nondisplaced distal fibular fracture. No other fractures identified at that time.  Patient has been in soft cast for last two weeks, using walker to ambulate. He states pain is overall well managed. Pain not exacerbated by walking (with aid of walker) around house. Pain is worst however in the morning. States that he wake up with increased swelling and a throbbing sensation. Responds well to percocet. Has been using percocet on limited basis, less than once a day as needed.   History reviewed. No pertinent past medical history. History reviewed. No pertinent surgical history. Social History   Tobacco Use  . Smoking status: Never Smoker  . Smokeless tobacco: Never Used  Substance Use Topics  . Alcohol use: No    Frequency: Never     ROS:  As above   Medications: Current Outpatient Medications  Medication Sig Dispense Refill  . AMBULATORY NON FORMULARY MEDICATION Standard Walker for use with ankle fracture.  U27.253G 1 each 0  . atorvastatin (LIPITOR) 80 MG tablet     . LEVEMIR FLEXTOUCH 100 UNIT/ML Pen     . metFORMIN (GLUCOPHAGE) 1000 MG tablet     . metoprolol tartrate (LOPRESSOR) 25 MG tablet TAKE 1 TABLET BY MOUTH TWICE DAILY    . oxyCODONE-acetaminophen (PERCOCET/ROXICET) 5-325 MG tablet Take 1 tablet every 6 (six) hours as needed by mouth. 15 tablet 0  . prasugrel (EFFIENT) 10 MG TABS tablet TAKE ONE TABLET BY MOUTH ONCE DAILY    . ranolazine (RANEXA) 500 MG 12 hr tablet Take 500 mg by mouth.     No current facility-administered medications for this visit.    No Known Allergies   Exam:  BP (!) 155/58    Pulse 60   Temp 98.2 F (36.8 C) (Oral)   Resp 16   Wt 271 lb 1.9 oz (123 kg)   SpO2 97%  General: Well Developed, well nourished, and in no acute distress.  Neuro/Psych: Alert and oriented x3, extra-ocular muscles intact, able to move all 4 extremities, sensation grossly intact. Skin: Warm and dry, no rashes noted.  Respiratory: Not using accessory muscles, speaking in full sentences, trachea midline.  Cardiovascular: Pulses palpable, no extremity edema. Abdomen: Does not appear distended. MSK:  L. Ankle: Minimally swollen with no overlying erythema Ankle grossly intact with preserved sensation Plantar/dorsiflexion intact and without pain.  Left ankle Xray 11/20:  Shows nondisplaced distal fibular fracture. Stable from prior xray. No evidence of displacement from prior xray on 11/6. Awaiting further radiologist review.     Assessment and Plan: 71 y.o. male with nondisplaced distal fibular fracture, sustained 11/1. Patient should continue to use soft walking boot and walker. In observing his gait, he appears to place approximately 50% of his weight over leg. This is not ideal, but he is without pain and Xrays are stable. He will benefit from continued use of boot with walker. Counseled to minimize weight baring as much as possible.  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 Asked for job description from employer to inform my recommendation. It is likely that he will be able to return to work on limited basis, driving only (no lifting,  getting in and out of car) in the next several weeks.    Pain is well controlled with minimal percocet requirement.   Patient should return to follow up in 2 weeks.   Orders Placed This Encounter  Procedures  . DG Ankle Complete Left    Standing Status:   Future    Number of Occurrences:   1    Standing Expiration Date:   06/22/2018    Order Specific Question:   Reason for Exam (SYMPTOM  OR DIAGNOSIS REQUIRED)    Answer:   f/u fx    Order  Specific Question:   Preferred imaging location?    Answer:   [facility]    Order Specific Question:   Radiology Contrast Protocol - do NOT remove file path    Answer:   [email or web address]   No orders of the defined types were placed in this encounter.   Discussed warning signs or symptoms. Please see discharge instructions. Patient expresses understanding.

## 2017-05-06 ENCOUNTER — Ambulatory Visit (INDEPENDENT_AMBULATORY_CARE_PROVIDER_SITE_OTHER): Payer: Medicare Other

## 2017-05-06 ENCOUNTER — Encounter: Payer: Self-pay | Admitting: Family Medicine

## 2017-05-06 ENCOUNTER — Ambulatory Visit (INDEPENDENT_AMBULATORY_CARE_PROVIDER_SITE_OTHER): Payer: Medicare Other | Admitting: Family Medicine

## 2017-05-06 VITALS — BP 166/84 | HR 61 | Ht 66.0 in | Wt 278.0 lb

## 2017-05-06 DIAGNOSIS — S82892A Other fracture of left lower leg, initial encounter for closed fracture: Secondary | ICD-10-CM | POA: Diagnosis not present

## 2017-05-06 DIAGNOSIS — S82832G Other fracture of upper and lower end of left fibula, subsequent encounter for closed fracture with delayed healing: Secondary | ICD-10-CM

## 2017-05-06 DIAGNOSIS — X58XXXD Exposure to other specified factors, subsequent encounter: Secondary | ICD-10-CM

## 2017-05-06 DIAGNOSIS — S82832A Other fracture of upper and lower end of left fibula, initial encounter for closed fracture: Secondary | ICD-10-CM | POA: Diagnosis not present

## 2017-05-06 NOTE — Progress Notes (Signed)
John Ali is a 71 y.o. male who presents to [facility] today for follow up of left nondisplaced distal fibular fracture.  Patient with history of left distal leg crush injury on 11/1 after work-place accident. Patient seen initially by Dr. Burnett Harry on 11/2 for severe left ankle and knee pain. Initial xrays notable for left nondisplaced distal fibular fracture. No other fractures identified at that time.  Patient has been in cam walker for last four weeks, using walker to ambulate. Does walk without walker around house on occasion. States that he is wearing boot at all times, including sleep. Only takes it off to shower, but does not have pain when transferring in and out of shower. No pain to the touch, per patient.   No past medical history on file. No past surgical history on file. Social History   Tobacco Use  . Smoking status: Never Smoker  . Smokeless tobacco: Never Used  Substance Use Topics  . Alcohol use: No    Frequency: Never    ROS:  As above   Medications: Current Outpatient Medications  Medication Sig Dispense Refill  . AMBULATORY NON FORMULARY MEDICATION Standard Walker for use with ankle fracture.  X32.355D 1 each 0  . atorvastatin (LIPITOR) 80 MG tablet     . LEVEMIR FLEXTOUCH 100 UNIT/ML Pen     . metFORMIN (GLUCOPHAGE) 1000 MG tablet     . metoprolol tartrate (LOPRESSOR) 25 MG tablet TAKE 1 TABLET BY MOUTH TWICE DAILY    . oxyCODONE-acetaminophen (PERCOCET/ROXICET) 5-325 MG tablet Take 1 tablet every 6 (six) hours as needed by mouth. 15 tablet 0  . prasugrel (EFFIENT) 10 MG TABS tablet TAKE ONE TABLET BY MOUTH ONCE DAILY    . ranolazine (RANEXA) 500 MG 12 hr tablet Take 500 mg by mouth.     No current facility-administered medications for this visit.    No Known Allergies   Exam:  BP (!) 166/84   Pulse 61   Ht 5\' 6"  (1.676 m)   Wt 278 lb (126.1 kg)   BMI 44.87 kg/m  General: Well Developed, well  nourished, and in no acute distress.  Neuro/Psych: Alert and oriented x3, extra-ocular muscles intact, able to move all 4 extremities, sensation grossly intact. Skin: Warm and dry, no rashes noted.  Respiratory: Not using accessory muscles, speaking in full sentences, trachea midline.  Cardiovascular: Pulses palpable, no extremity edema. Abdomen: Does not appear distended. MSK:  L. Ankle: Nonswollen with no overlying erythema Ankle grossly intact with preserved sensation Plantar/dorsiflexion intact and without pain. Minimal pain to palpation over lateral ankle.   Left ankle Xray 12/4:  Shows healing nondisplaced distal fibular fracture. Stable from prior xray. No evidence of displacement from prior xray on 11/20. Awaiting further radiologist review.   Assessment and Plan: 71 y.o. male with nondisplaced distal fibular fracture, sustained 11/1. Xray stable with healing fracture today. Patient without pain in walking boot. Will transition to clamshell air cast starting today. If he has pain, he should return to use of walking boot. Patient may use walker as needed, coming off walker as tolerated.  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 He should avoid lifting or walking significant distances.   Patient should return to follow up in 2 weeks.   Orders Placed This Encounter  Procedures  . DG Ankle Complete Left    Standing Status:   Future    Number of Occurrences:   1    Standing  Expiration Date:   07/07/2018    Order Specific Question:   Reason for Exam (SYMPTOM  OR DIAGNOSIS REQUIRED)    Answer:   fu fx    Order Specific Question:   Preferred imaging location?    Answer:   [facility]    Order Specific Question:   Radiology Contrast Protocol - do NOT remove file path    Answer:   [email or web address]   No orders of the defined types were placed in this encounter.   Discussed warning signs or symptoms. Please see discharge  instructions. Patient expresses understanding.

## 2017-05-06 NOTE — Patient Instructions (Signed)
Thank you for coming in today. Use the clamshell brace.  If pain is worse transition back to the boot.

## 2017-05-20 ENCOUNTER — Encounter: Payer: Self-pay | Admitting: Family Medicine

## 2017-05-20 ENCOUNTER — Ambulatory Visit (INDEPENDENT_AMBULATORY_CARE_PROVIDER_SITE_OTHER): Payer: Medicare Other

## 2017-05-20 ENCOUNTER — Ambulatory Visit (INDEPENDENT_AMBULATORY_CARE_PROVIDER_SITE_OTHER): Payer: Medicare Other | Admitting: Family Medicine

## 2017-05-20 VITALS — BP 180/75 | HR 59 | Wt 277.0 lb

## 2017-05-20 DIAGNOSIS — X58XXXD Exposure to other specified factors, subsequent encounter: Secondary | ICD-10-CM

## 2017-05-20 DIAGNOSIS — S82832K Other fracture of upper and lower end of left fibula, subsequent encounter for closed fracture with nonunion: Secondary | ICD-10-CM

## 2017-05-20 DIAGNOSIS — S82892A Other fracture of left lower leg, initial encounter for closed fracture: Secondary | ICD-10-CM

## 2017-05-20 DIAGNOSIS — M19072 Primary osteoarthritis, left ankle and foot: Secondary | ICD-10-CM | POA: Diagnosis not present

## 2017-05-20 NOTE — Progress Notes (Signed)
John Ali is a 71 y.o. male who presents to [facility] today for left ankle fracture.  John Ali has been seen several times for fracture of his left ankle at the distal fibula.  The injury occurred on November 1.  He has been immobilized with a Cam Walker boot until 2 weeks ago where he was transition to a Chief Technology Officer device.  Since then he notes the pain is been worsening and throbbing.  He is not transition back to the boot.   No past medical history on file. No past surgical history on file. Social History   Tobacco Use  . Smoking status: Never Smoker  . Smokeless tobacco: Never Used  Substance Use Topics  . Alcohol use: No    Frequency: Never     ROS:  As above   Medications: Current Outpatient Medications  Medication Sig Dispense Refill  . AMBULATORY NON FORMULARY MEDICATION Standard Walker for use with ankle fracture.  T01.601U 1 each 0  . aspirin 81 MG chewable tablet Chew by mouth daily.    Marland Kitchen atorvastatin (LIPITOR) 80 MG tablet     . LEVEMIR FLEXTOUCH 100 UNIT/ML Pen     . levothyroxine (SYNTHROID, LEVOTHROID) 175 MCG tablet Take 175 mcg by mouth daily.    . metFORMIN (GLUCOPHAGE) 1000 MG tablet     . metoprolol tartrate (LOPRESSOR) 25 MG tablet TAKE 1 TABLET BY MOUTH TWICE DAILY    . oxyCODONE-acetaminophen (PERCOCET/ROXICET) 5-325 MG tablet Take 1 tablet every 6 (six) hours as needed by mouth. 15 tablet 0  . prasugrel (EFFIENT) 10 MG TABS tablet TAKE ONE TABLET BY MOUTH ONCE DAILY    . ranolazine (RANEXA) 500 MG 12 hr tablet Take 500 mg by mouth 2 (two) times daily.      No current facility-administered medications for this visit.    No Known Allergies   Exam:  BP (!) 180/75   Pulse (!) 59   Wt 277 lb (125.6 kg)   BMI 44.71 kg/m  General: Well Developed, well nourished, and in no acute distress.  Neuro/Psych: Alert and oriented x3, extra-ocular muscles intact, able to move all 4 extremities,  sensation grossly intact. Skin: Warm and dry, no rashes noted.  Respiratory: Not using accessory muscles, speaking in full sentences, trachea midline.  Cardiovascular: Pulses palpable, no extremity edema. Abdomen: Does not appear distended. MSK: Left ankle: well appearing. Non-tender to palpation  X-ray left ankle: No further displacement. Fracture line visible. Minimal healing Waiting formal radiology review    No results found for this or any previous visit (from the past 48 hour(s)). No results found.    Assessment and Plan: 71 y.o. male with left ankle fracture slowly healing. John Ali has had worsening pain.  Plan to transition back to the cam walker boot and recheck in 2-3 weeks.  Return sooner if needed.     Orders Placed This Encounter  Procedures  . DG Ankle Complete Left    Standing Status:   Future    Standing Expiration Date:   07/21/2018    Order Specific Question:   Reason for Exam (SYMPTOM  OR DIAGNOSIS REQUIRED)    Answer:   follow up fractute    Order Specific Question:   Preferred imaging location?    Answer[facility]    Order Specific Question:   Radiology Contrast Protocol - do NOT remove file path    Answer:   [email or web address]   No orders of the  defined types were placed in this encounter.   Discussed warning signs or symptoms. Please see discharge instructions. Patient expresses understanding.

## 2017-05-20 NOTE — Patient Instructions (Signed)
Thank you for coming in today. Resume the cam walker boot.  Recheck in 2-3 weeks.  Return sooner if needed.

## 2017-05-31 DIAGNOSIS — N201 Calculus of ureter: Secondary | ICD-10-CM | POA: Diagnosis not present

## 2017-05-31 DIAGNOSIS — E119 Type 2 diabetes mellitus without complications: Secondary | ICD-10-CM | POA: Diagnosis not present

## 2017-05-31 DIAGNOSIS — N132 Hydronephrosis with renal and ureteral calculous obstruction: Secondary | ICD-10-CM | POA: Diagnosis not present

## 2017-05-31 DIAGNOSIS — Z01810 Encounter for preprocedural cardiovascular examination: Secondary | ICD-10-CM | POA: Diagnosis not present

## 2017-05-31 DIAGNOSIS — Z7982 Long term (current) use of aspirin: Secondary | ICD-10-CM | POA: Diagnosis not present

## 2017-05-31 DIAGNOSIS — E7800 Pure hypercholesterolemia, unspecified: Secondary | ICD-10-CM | POA: Diagnosis not present

## 2017-05-31 DIAGNOSIS — Z87442 Personal history of urinary calculi: Secondary | ICD-10-CM | POA: Diagnosis not present

## 2017-05-31 DIAGNOSIS — K8020 Calculus of gallbladder without cholecystitis without obstruction: Secondary | ICD-10-CM | POA: Diagnosis not present

## 2017-05-31 DIAGNOSIS — N200 Calculus of kidney: Secondary | ICD-10-CM | POA: Diagnosis not present

## 2017-05-31 DIAGNOSIS — Z466 Encounter for fitting and adjustment of urinary device: Secondary | ICD-10-CM | POA: Diagnosis not present

## 2017-05-31 DIAGNOSIS — I10 Essential (primary) hypertension: Secondary | ICD-10-CM | POA: Diagnosis not present

## 2017-05-31 DIAGNOSIS — Z794 Long term (current) use of insulin: Secondary | ICD-10-CM | POA: Diagnosis not present

## 2017-05-31 DIAGNOSIS — R109 Unspecified abdominal pain: Secondary | ICD-10-CM | POA: Diagnosis not present

## 2017-05-31 DIAGNOSIS — R079 Chest pain, unspecified: Secondary | ICD-10-CM | POA: Diagnosis not present

## 2017-06-02 DIAGNOSIS — R8290 Unspecified abnormal findings in urine: Secondary | ICD-10-CM | POA: Diagnosis not present

## 2017-06-02 DIAGNOSIS — N200 Calculus of kidney: Secondary | ICD-10-CM | POA: Diagnosis not present

## 2017-06-10 ENCOUNTER — Ambulatory Visit (INDEPENDENT_AMBULATORY_CARE_PROVIDER_SITE_OTHER): Payer: Medicare Other

## 2017-06-10 ENCOUNTER — Encounter: Payer: Self-pay | Admitting: Family Medicine

## 2017-06-10 ENCOUNTER — Ambulatory Visit (INDEPENDENT_AMBULATORY_CARE_PROVIDER_SITE_OTHER): Payer: Medicare Other | Admitting: Family Medicine

## 2017-06-10 VITALS — BP 162/74 | HR 69 | Ht 66.0 in | Wt 271.0 lb

## 2017-06-10 DIAGNOSIS — X58XXXD Exposure to other specified factors, subsequent encounter: Secondary | ICD-10-CM

## 2017-06-10 DIAGNOSIS — S82832K Other fracture of upper and lower end of left fibula, subsequent encounter for closed fracture with nonunion: Secondary | ICD-10-CM

## 2017-06-10 DIAGNOSIS — S82892A Other fracture of left lower leg, initial encounter for closed fracture: Secondary | ICD-10-CM

## 2017-06-10 NOTE — Progress Notes (Signed)
John Ali is a 72 y.o. male who presents to [facility] today for left ankle fracture. John Ali has been seen several times for fracture of his left ankle at the distal fibula. The injury occurred on November 1. He has been doing well since that time. He notes minimal to no pain at baseline in his ankle. He does say that after extended periods of walking or by the end of the day his ankle occasionally has a dull ache. Otherwise not tender to the touch. He wears his work boots, which provide good support.  No past medical history on file. No past surgical history on file. Social History   Tobacco Use  . Smoking status: Never Smoker  . Smokeless tobacco: Never Used  Substance Use Topics  . Alcohol use: No    Frequency: Never    ROS:  As above  Medications: Current Outpatient Medications  Medication Sig Dispense Refill  . AMBULATORY NON FORMULARY MEDICATION Standard Walker for use with ankle fracture.  C78.938B 1 each 0  . aspirin 81 MG chewable tablet Chew by mouth daily.    Marland Kitchen atorvastatin (LIPITOR) 80 MG tablet     . LEVEMIR FLEXTOUCH 100 UNIT/ML Pen     . levothyroxine (SYNTHROID, LEVOTHROID) 175 MCG tablet Take 175 mcg by mouth daily.    . metFORMIN (GLUCOPHAGE) 1000 MG tablet     . metoprolol tartrate (LOPRESSOR) 25 MG tablet TAKE 1 TABLET BY MOUTH TWICE DAILY    . oxyCODONE-acetaminophen (PERCOCET/ROXICET) 5-325 MG tablet Take 1 tablet every 6 (six) hours as needed by mouth. 15 tablet 0  . prasugrel (EFFIENT) 10 MG TABS tablet TAKE ONE TABLET BY MOUTH ONCE DAILY    . ranolazine (RANEXA) 500 MG 12 hr tablet Take 500 mg by mouth 2 (two) times daily.      No current facility-administered medications for this visit.    No Known Allergies   Exam:  BP (!) 162/74   Pulse 69   Ht 5\' 6"  (1.676 m)   Wt 271 lb (122.9 kg)   BMI 43.74 kg/m  General: Well Developed, well nourished, and in no acute distress.  Neuro/Psych: Alert  and oriented x3, extra-ocular muscles intact, able to move all 4 extremities, sensation grossly intact. Skin: Warm and dry, no rashes noted.  Respiratory: Not using accessory muscles, speaking in full sentences, trachea midline.  Cardiovascular: Pulses palpable, no extremity edema. Abdomen: Does not appear distended. MSK:  L ankle: Normal in appearance without swelling or overlying erythema. Nontender to palpation. No tenderness with flexion/extension of ankle.  No results found for this or any previous visit (from the past 48 hour(s)). Dg Ankle Complete Left  Result Date: 06/10/2017 CLINICAL DATA:  Follow-up left ankle fracture. EXAM: LEFT ANKLE COMPLETE - 3+ VIEW COMPARISON:  05/20/2017. FINDINGS: Distal left fibular fracture is again noted and unchanged in appearance. Findings consistent with nonunion. No acute abnormality. Vascular calcification noted IMPRESSION: Distal left fibular fractures again noted and unchanged in appearance. Findings consistent with nonunion. Electronically Signed   By: Marcello Moores  Register   On: 06/10/2017 08:44    Assessment and Plan: 72 y.o. male with left distal fibular fracture. Patient is slowly improving, now with minimal to no pain while walking in a normal shoe. He is ok to return to work.  Patient clinically is doing quite well despite nonunion on x-ray.  Continue full weightbearing and activity as tolerated.  He should return to follow up in 4 weeks.  Orders Placed This Encounter  Procedures  . DG Ankle Complete Left    Standing Status:   Future    Number of Occurrences:   1    Standing Expiration Date:   08/09/2018    Order Specific Question:   Reason for Exam (SYMPTOM  OR DIAGNOSIS REQUIRED)    Answer:   f/u fx    Order Specific Question:   Preferred imaging location?    Answer:   [facility]    Order Specific Question:   Radiology Contrast Protocol - do NOT remove file path    Answer:    [email or web address]   No orders of the defined types were placed in this encounter.  Discussed warning signs or symptoms. Please see discharge instructions. Patient expresses understanding.

## 2017-06-10 NOTE — Patient Instructions (Signed)
Thank you for coming in today. Ok to return to work.  Recheck with me in 1 month.  If not doing well let me know.

## 2017-06-12 DIAGNOSIS — N200 Calculus of kidney: Secondary | ICD-10-CM | POA: Diagnosis not present

## 2017-06-25 DIAGNOSIS — N200 Calculus of kidney: Secondary | ICD-10-CM | POA: Diagnosis not present

## 2017-06-25 DIAGNOSIS — K8020 Calculus of gallbladder without cholecystitis without obstruction: Secondary | ICD-10-CM | POA: Diagnosis not present

## 2017-06-26 DIAGNOSIS — N200 Calculus of kidney: Secondary | ICD-10-CM | POA: Diagnosis not present

## 2017-07-04 DIAGNOSIS — I2510 Atherosclerotic heart disease of native coronary artery without angina pectoris: Secondary | ICD-10-CM | POA: Diagnosis not present

## 2017-07-04 DIAGNOSIS — E039 Hypothyroidism, unspecified: Secondary | ICD-10-CM | POA: Diagnosis not present

## 2017-07-04 DIAGNOSIS — E1165 Type 2 diabetes mellitus with hyperglycemia: Secondary | ICD-10-CM | POA: Diagnosis not present

## 2017-07-04 DIAGNOSIS — E782 Mixed hyperlipidemia: Secondary | ICD-10-CM | POA: Diagnosis not present

## 2017-07-10 DIAGNOSIS — K8070 Calculus of gallbladder and bile duct without cholecystitis without obstruction: Secondary | ICD-10-CM | POA: Diagnosis not present

## 2017-07-14 ENCOUNTER — Ambulatory Visit (INDEPENDENT_AMBULATORY_CARE_PROVIDER_SITE_OTHER): Payer: Medicare Other

## 2017-07-14 ENCOUNTER — Encounter: Payer: Self-pay | Admitting: Family Medicine

## 2017-07-14 ENCOUNTER — Ambulatory Visit (INDEPENDENT_AMBULATORY_CARE_PROVIDER_SITE_OTHER): Payer: Medicare Other | Admitting: Family Medicine

## 2017-07-14 VITALS — BP 171/90 | HR 57 | Wt 275.0 lb

## 2017-07-14 DIAGNOSIS — M1710 Unilateral primary osteoarthritis, unspecified knee: Secondary | ICD-10-CM | POA: Insufficient documentation

## 2017-07-14 DIAGNOSIS — X58XXXD Exposure to other specified factors, subsequent encounter: Secondary | ICD-10-CM

## 2017-07-14 DIAGNOSIS — S89302D Unspecified physeal fracture of lower end of left fibula, subsequent encounter for fracture with routine healing: Secondary | ICD-10-CM

## 2017-07-14 DIAGNOSIS — S82892A Other fracture of left lower leg, initial encounter for closed fracture: Secondary | ICD-10-CM

## 2017-07-14 MED ORDER — DICLOFENAC SODIUM 1 % TD GEL: 4.0000 g | Freq: Four times a day (QID) | TRANSDERMAL | 11 refills | Status: AC

## 2017-07-14 NOTE — Progress Notes (Signed)
John Ali is a 72 y.o. male who presents to [facility] today for left knee pain.  Patient with multiple year history of left knee pain. Pain was generally well managed with intermittent steroid injections and celecoxib. Patient worsened this past November after his left ankle fracture, suffered in a work place accident. After this point, pain worsened significantly. Patient now with significant knee pain after prolonged periods of sitting. Pain also with walking. Patient states that pain is constant along the medial aspect of his knee. Patient also endorsing a popping sensation when he straightens his leg. Neither hydrocodone (prescribed for fracture) or tylenol were helpful for pain. Patient can no longer take NSAIDs regularly due to other health issues.   Patient did have outside xray of left knee taken in November. We independently reviewed these xrays, which show significant DJD and patellofemoral chondromalacia. Most consistent with osteoarthritis. No evidence of fracture.   Patient denies any trauma to knee. Instead, pain came on gradually following ankle fracture. Presentation not concerning for fracture. The issue is significantly impairing his ability to work.   Left Ankle: patient also with healing left ankle fracture. Patient completely asymptomatic at this point. No tenderness or pain with ambulation.     Social History   Tobacco Use  . Smoking status: Never Smoker  . Smokeless tobacco: Never Used  Substance Use Topics  . Alcohol use: No    Frequency: Never     ROS:  As above   Medications: Current Outpatient Medications  Medication Sig Dispense Refill  . AMBULATORY NON FORMULARY MEDICATION Standard Walker for use with ankle fracture.  H41.937T 1 each 0  . aspirin 81 MG chewable tablet Chew by mouth daily.    Marland Kitchen atorvastatin (LIPITOR) 80 MG tablet     . LEVEMIR FLEXTOUCH 100 UNIT/ML Pen     . levothyroxine  (SYNTHROID, LEVOTHROID) 175 MCG tablet Take 175 mcg by mouth daily.    . metFORMIN (GLUCOPHAGE) 1000 MG tablet     . metoprolol tartrate (LOPRESSOR) 25 MG tablet TAKE 1 TABLET BY MOUTH TWICE DAILY    . oxyCODONE-acetaminophen (PERCOCET/ROXICET) 5-325 MG tablet Take 1 tablet every 6 (six) hours as needed by mouth. 15 tablet 0  . Potassium Citrate 15 MEQ (1620 MG) TBCR Take by mouth.    . prasugrel (EFFIENT) 10 MG TABS tablet TAKE ONE TABLET BY MOUTH ONCE DAILY    . ranolazine (RANEXA) 500 MG 12 hr tablet Take 500 mg by mouth 2 (two) times daily.     . diclofenac sodium (VOLTAREN) 1 % GEL Apply 4 g topically 4 (four) times daily. To affected joint. 100 g 11   No current facility-administered medications for this visit.    Allergies  Allergen Reactions  . Zolpidem Other (See Comments)     Exam:  BP (!) 171/90   Pulse (!) 57   Wt 275 lb (124.7 kg)   BMI 44.39 kg/m  General: Well Developed, well nourished, and in no acute distress.  Neuro/Psych: Alert and oriented x3, extra-ocular muscles intact, able to move all 4 extremities, sensation grossly intact. Skin: Warm and dry, no rashes noted.  Respiratory: Not using accessory muscles, speaking in full sentences, trachea midline.  Cardiovascular: Pulses palpable, no extremity edema. Abdomen: Does not appear distended. MSK:  L Knee: Normal in appearance without overlying erythema. No palpable knee effusion. No tenderness along quad or patellar tendons. Tenderness along medial joint line. Anterior/posterior drawer sign negative. Negative McMurphy's  Procedure: Real-time Ultrasound Guided Injection of left knee  Device: GE Logiq E  Images permanently stored and available for review in the ultrasound unit. Verbal informed consent obtained. Discussed risks and benefits of procedure. Warned about infection bleeding damage to structures skin hypopigmentation and fat atrophy among others. Patient expresses understanding and  agreement Time-out conducted.  Noted no overlying erythema, induration, or other signs of local infection.  Skin prepped in a sterile fashion.  Local anesthesia: Topical Ethyl chloride.  With sterile technique and under real time ultrasound guidance: 80mg  kenalog and 65ml marcaine injected easily.  Completed without difficulty  Pain immediately resolved suggesting accurate placement of the medication.  Advised to call if fevers/chills, erythema, induration, drainage, or persistent bleeding.  Images permanently stored and available for review in the ultrasound unit.  Impression: Technically successful ultrasound guided injection.   Ankle x-ray independently reviewed by myself. No results found for this or any previous visit (from the past 48 hour(s)). Dg Ankle Complete Left  Result Date: 07/14/2017 CLINICAL DATA:  Six week follow-up left ankle fracture. EXAM: LEFT ANKLE COMPLETE - 3+ VIEW COMPARISON:  04/04/2017 and 06/10/2017 FINDINGS: Findings demonstrate evidence of patient's distal left fibular metaphyseal fracture with only subtle residual lateral cortical irregularity. No acute fractures identified. Ankle mortise is within normal. Remainder of the exam is unchanged. IMPRESSION: Evidence of interval healing distal left fibular metaphyseal fracture. Electronically Signed   By: Marin Olp M.D.   On: 07/14/2017 08:06     Assessment and Plan: 72 y.o. male with left knee pain. Patient's presentation most consistent with extensive DJD. Patient's symptoms of popping, stiffness, and pain with weight bearing activity most consistent with worsening of underlying osteoarthritis.  No indication for re-imaging after November as history is not concerning for fracture. Patient was counseled on management options. He elected for US guided steroid injection and topical Voltaren gel. If pain does not improve, we can consider gel injection as next step. Ultimately, it is likely that patient will  need referral to orthopedic surgery for knee replacement.   Ordered xrays of left ankle at this visit. Patient completely asymptomatic from prior fracture at this point. I do not anticipate he will need further intervention.   Orders Placed This Encounter  Procedures  . DG Ankle Complete Left    Standing Status:   Future    Number of Occurrences:   1    Standing Expiration Date:   09/12/2018    Order Specific Question:   Reason for Exam (SYMPTOM  OR DIAGNOSIS REQUIRED)    Answer:   follow up fracture    Order Specific Question:   Preferred imaging location?    Answer:   [facility]    Order Specific Question:   Radiology Contrast Protocol - do NOT remove file path    Answer:   [email or web address]   Meds ordered this encounter  Medications  . diclofenac sodium (VOLTAREN) 1 % GEL    Sig: Apply 4 g topically 4 (four) times daily. To affected joint.    Dispense:  100 g    Refill:  11    Discussed warning signs or symptoms. Please see discharge instructions. Patient expresses understanding.

## 2017-07-14 NOTE — Patient Instructions (Addendum)
Thank you for coming in today. Call or go to the ER if you develop a large red swollen joint with extreme pain or oozing puss.  Recheck in 2-4 weeks.  If not better after the shot let me know and I will order the gel shots.  Apply the diclofenac gel 4x daily.  Recheck sooner if needed.    Arthritis Arthritis means joint pain. It can also mean joint disease. A joint is a place where bones come together. People who have arthritis may have:  Red joints.  Swollen joints.  Stiff joints.  Warm joints.  A fever.  A feeling of being sick.  Follow these instructions at home: Pay attention to any changes in your symptoms. Take these actions to help with your pain and swelling. Medicines  Take over-the-counter and prescription medicines only as told by your doctor.  Do not take aspirin for pain if your doctor says that you may have gout. Activity  Rest your joint if your doctor tells you to.  Avoid activities that make the pain worse.  Exercise your joint regularly as told by your doctor. Try doing exercises like: ? Swimming. ? Water aerobics. ? Biking. ? Walking. Joint Care   If your joint is swollen, keep it raised (elevated) if told by your doctor.  If your joint feels stiff in the morning, try taking a warm shower.  If you have diabetes, do not apply heat without asking your doctor.  If told, apply heat to the joint: ? Put a towel between the joint and the hot pack or heating pad. ? Leave the heat on the area for 20-30 minutes.  If told, apply ice to the joint: ? Put ice in a plastic bag. ? Place a towel between your skin and the bag. ? Leave the ice on for 20 minutes, 2-3 times per day.  Keep all follow-up visits as told by your doctor. Contact a doctor if:  The pain gets worse.  You have a fever. Get help right away if:  You have very bad pain in your joint.  You have swelling in your joint.  Your joint is red.  Many joints become painful and  swollen.  You have very bad back pain.  Your leg is very weak.  You cannot control your pee (urine) or poop (stool). This information is not intended to replace advice given to you by your health care provider. Make sure you discuss any questions you have with your health care provider. Document Released: 08/14/2009 Document Revised: 10/26/2015 Document Reviewed: 08/15/2014 Elsevier Interactive Patient Education  [employer].

## 2017-07-16 DIAGNOSIS — K8070 Calculus of gallbladder and bile duct without cholecystitis without obstruction: Secondary | ICD-10-CM | POA: Diagnosis not present

## 2017-07-16 DIAGNOSIS — K8020 Calculus of gallbladder without cholecystitis without obstruction: Secondary | ICD-10-CM | POA: Diagnosis not present

## 2017-07-17 DIAGNOSIS — E1169 Type 2 diabetes mellitus with other specified complication: Secondary | ICD-10-CM | POA: Diagnosis not present

## 2017-07-21 DIAGNOSIS — K8070 Calculus of gallbladder and bile duct without cholecystitis without obstruction: Secondary | ICD-10-CM | POA: Diagnosis not present

## 2017-07-21 DIAGNOSIS — E119 Type 2 diabetes mellitus without complications: Secondary | ICD-10-CM | POA: Diagnosis not present

## 2017-07-21 DIAGNOSIS — I25812 Atherosclerosis of bypass graft of coronary artery of transplanted heart without angina pectoris: Secondary | ICD-10-CM | POA: Diagnosis not present

## 2017-07-21 DIAGNOSIS — Z955 Presence of coronary angioplasty implant and graft: Secondary | ICD-10-CM | POA: Diagnosis not present

## 2017-07-21 DIAGNOSIS — I10 Essential (primary) hypertension: Secondary | ICD-10-CM | POA: Diagnosis not present

## 2017-07-24 DIAGNOSIS — E1165 Type 2 diabetes mellitus with hyperglycemia: Secondary | ICD-10-CM | POA: Diagnosis not present

## 2017-07-24 DIAGNOSIS — E039 Hypothyroidism, unspecified: Secondary | ICD-10-CM | POA: Diagnosis not present

## 2017-07-24 DIAGNOSIS — E782 Mixed hyperlipidemia: Secondary | ICD-10-CM | POA: Diagnosis not present

## 2017-07-24 DIAGNOSIS — I10 Essential (primary) hypertension: Secondary | ICD-10-CM | POA: Diagnosis not present

## 2017-07-24 DIAGNOSIS — I2510 Atherosclerotic heart disease of native coronary artery without angina pectoris: Secondary | ICD-10-CM | POA: Diagnosis not present

## 2017-08-01 DIAGNOSIS — K8070 Calculus of gallbladder and bile duct without cholecystitis without obstruction: Secondary | ICD-10-CM | POA: Diagnosis not present

## 2017-08-01 DIAGNOSIS — K8000 Calculus of gallbladder with acute cholecystitis without obstruction: Secondary | ICD-10-CM | POA: Diagnosis not present

## 2017-08-01 DIAGNOSIS — I2510 Atherosclerotic heart disease of native coronary artery without angina pectoris: Secondary | ICD-10-CM | POA: Diagnosis not present

## 2017-08-01 DIAGNOSIS — Z7982 Long term (current) use of aspirin: Secondary | ICD-10-CM | POA: Diagnosis not present

## 2017-08-01 DIAGNOSIS — Z7902 Long term (current) use of antithrombotics/antiplatelets: Secondary | ICD-10-CM | POA: Diagnosis not present

## 2017-08-01 DIAGNOSIS — I252 Old myocardial infarction: Secondary | ICD-10-CM | POA: Diagnosis not present

## 2017-08-01 DIAGNOSIS — Z955 Presence of coronary angioplasty implant and graft: Secondary | ICD-10-CM | POA: Diagnosis not present

## 2017-08-01 DIAGNOSIS — K8010 Calculus of gallbladder with chronic cholecystitis without obstruction: Secondary | ICD-10-CM | POA: Diagnosis not present

## 2017-08-11 ENCOUNTER — Ambulatory Visit (INDEPENDENT_AMBULATORY_CARE_PROVIDER_SITE_OTHER): Payer: Medicare Other

## 2017-08-11 ENCOUNTER — Ambulatory Visit (INDEPENDENT_AMBULATORY_CARE_PROVIDER_SITE_OTHER): Payer: Medicare Other | Admitting: Family Medicine

## 2017-08-11 ENCOUNTER — Encounter: Payer: Self-pay | Admitting: Family Medicine

## 2017-08-11 VITALS — BP 175/88 | HR 67 | Ht 66.0 in | Wt 272.0 lb

## 2017-08-11 DIAGNOSIS — M1710 Unilateral primary osteoarthritis, unspecified knee: Secondary | ICD-10-CM

## 2017-08-11 DIAGNOSIS — S82892A Other fracture of left lower leg, initial encounter for closed fracture: Secondary | ICD-10-CM | POA: Diagnosis not present

## 2017-08-11 DIAGNOSIS — S82892D Other fracture of left lower leg, subsequent encounter for closed fracture with routine healing: Secondary | ICD-10-CM

## 2017-08-11 DIAGNOSIS — X58XXXD Exposure to other specified factors, subsequent encounter: Secondary | ICD-10-CM

## 2017-08-11 DIAGNOSIS — S82402A Unspecified fracture of shaft of left fibula, initial encounter for closed fracture: Secondary | ICD-10-CM | POA: Diagnosis not present

## 2017-08-11 NOTE — Progress Notes (Signed)
John Ali is a 72 y.o. male who presents to [facility]: [facility] today for follow-up left ankle fracture and left knee pain.   John Ali suffered a left ankle fraction several months ago.  It has healed clinically well but still has residual fracture seen on x-ray at the last check a month ago.  Clinically he notes his ankle is not bothering him very much.  He feels well.  Knee pain: John Ali has left knee pain exacerbation of his arthritis from his injury and resulting limping.  He had a steroid injection last month which worked a little while.  Additionally he has been using diclofenac gel.  He notes continued residual knee pain and is interested in Orthovisc or other hyaluronic acid injections.   No past medical history on file. No past surgical history on file. Social History   Tobacco Use  . Smoking status: Never Smoker  . Smokeless tobacco: Never Used  Substance Use Topics  . Alcohol use: No    Frequency: Never   family history is not on file.  ROS as above:  Medications: Current Outpatient Medications  Medication Sig Dispense Refill  . AMBULATORY NON FORMULARY MEDICATION Standard Walker for use with ankle fracture.  T02.409B 1 each 0  . aspirin 81 MG chewable tablet Chew by mouth daily.    Marland Kitchen atorvastatin (LIPITOR) 80 MG tablet     . diclofenac sodium (VOLTAREN) 1 % GEL Apply 4 g topically 4 (four) times daily. To affected joint. 100 g 11  . LEVEMIR FLEXTOUCH 100 UNIT/ML Pen     . levothyroxine (SYNTHROID, LEVOTHROID) 175 MCG tablet Take 175 mcg by mouth daily.    . metFORMIN (GLUCOPHAGE) 1000 MG tablet     . metoprolol tartrate (LOPRESSOR) 25 MG tablet TAKE 1 TABLET BY MOUTH TWICE DAILY    . oxyCODONE-acetaminophen (PERCOCET/ROXICET) 5-325 MG tablet Take 1 tablet every 6 (six) hours as needed by mouth. 15 tablet 0  . Potassium Citrate 15 MEQ (1620  MG) TBCR Take by mouth.    . prasugrel (EFFIENT) 10 MG TABS tablet TAKE ONE TABLET BY MOUTH ONCE DAILY    . ranolazine (RANEXA) 500 MG 12 hr tablet Take 500 mg by mouth 2 (two) times daily.      No current facility-administered medications for this visit.    Allergies  Allergen Reactions  . Zolpidem Other (See Comments)    Health Maintenance Health Maintenance  Topic Date Due  . Hepatitis C Screening  [birth year]  . TETANUS/TDAP  12/09/1966  . COLONOSCOPY  12/08/1997  . PNA vac Low Risk Adult (1 of 2 - PCV13) 12/08/2012  . INFLUENZA VACCINE  01/01/2017     Exam:  BP (!) 175/88   Pulse 67   Ht 5\' 6"  (1.676 m)   Wt 272 lb (123.4 kg)   BMI 43.90 kg/m  Gen: Well NAD Left knee: Normal motion with crepitations.  Nontender.  Extension strength is intact. Left ankle.  Nontender normal motion.  Stable ligamentous exam  No results found for this or any previous visit (from the past 72 hour(s)).  Dg Ankle Complete Left  Result Date: 08/11/2017 CLINICAL DATA:  Recent fracture with persistent pain EXAM: LEFT ANKLE COMPLETE - 3+ VIEW COMPARISON:  July 14, 2017 and June 10, 2017 FINDINGS: Frontal, oblique, and lateral views were obtained. Fracture of the distal fibula is again noted with an overall increase in callus formation compared to most  recent study consistent with further healing response. Alignment near anatomic. No new fracture. Ankle mortise appears intact. No appreciable joint effusion. There is osteoarthritic change medially. There is spurring along the inferior and posterior calcaneus. There is extensive arterial vascular calcification. IMPRESSION: Evidence of further healing of distal fibular fracture with alignment near anatomic. No new fracture. Areas of underlying osteoarthritic change as well as calcaneal spurs. Extensive arterial vascular calcification noted. Electronically Signed   By: Lowella Grip III M.D.   On: 08/11/2017 08:09   I personally (independently)  visualized and performed the interpretation of the images attached in this note.    Assessment and Plan: 72 y.o. male with  Left knee pain: Exacerbation of DJD due to injury.  Failed steroid injection not sufficiently controlled with voltaren gel.  Proceed with trial of Orthovisc injection.  Left ankle fracture: Clinically doing well. Plan for watchful waiting.    Orders Placed This Encounter  Procedures  . DG Ankle Complete Left    Standing Status:   Future    Number of Occurrences:   1    Standing Expiration Date:   10/12/2018    Order Specific Question:   Reason for Exam (SYMPTOM  OR DIAGNOSIS REQUIRED)    Answer:   eval fracture    Order Specific Question:   Preferred imaging location?    Answer:   [facility]    Order Specific Question:   Radiology Contrast Protocol - do NOT remove file path    Answer:   \\[email or web address]   No orders of the defined types were placed in this encounter.    Discussed warning signs or symptoms. Please see discharge instructions. Patient expresses understanding.

## 2017-08-11 NOTE — Patient Instructions (Signed)
Thank you for coming in today. You should hear about the gel shots soon.  Otherwise recheck as needed.  We will let you know what the radiologist says about your ankle.

## 2017-08-13 ENCOUNTER — Telehealth: Payer: Self-pay | Admitting: Family Medicine

## 2017-08-13 NOTE — Telephone Encounter (Signed)
I am placing the forms for the Orthovisc in the provider's box. Patient has been notified that we are working on approving the injections for the knee pain. Awaiting determination.

## 2017-08-13 NOTE — Telephone Encounter (Signed)
-----   Message from Tasia Catchings, [place] sent at 08/13/2017 11:25 AM EDT ----- Forms have been filled out and awaiting for provider signature.  ----- Message ----- From: Gregor Hams, MD Sent: 08/11/2017   8:04 AM To: Tasia Catchings, CMA  Please auth orthovisc through workers comp

## 2017-12-18 DIAGNOSIS — E782 Mixed hyperlipidemia: Secondary | ICD-10-CM | POA: Diagnosis not present

## 2017-12-18 DIAGNOSIS — E1165 Type 2 diabetes mellitus with hyperglycemia: Secondary | ICD-10-CM | POA: Diagnosis not present

## 2017-12-18 DIAGNOSIS — I2510 Atherosclerotic heart disease of native coronary artery without angina pectoris: Secondary | ICD-10-CM | POA: Diagnosis not present

## 2017-12-25 DIAGNOSIS — E782 Mixed hyperlipidemia: Secondary | ICD-10-CM | POA: Diagnosis not present

## 2017-12-25 DIAGNOSIS — E1165 Type 2 diabetes mellitus with hyperglycemia: Secondary | ICD-10-CM | POA: Diagnosis not present

## 2017-12-25 DIAGNOSIS — I2510 Atherosclerotic heart disease of native coronary artery without angina pectoris: Secondary | ICD-10-CM | POA: Diagnosis not present

## 2017-12-25 DIAGNOSIS — M545 Low back pain: Secondary | ICD-10-CM | POA: Diagnosis not present

## 2017-12-25 DIAGNOSIS — E039 Hypothyroidism, unspecified: Secondary | ICD-10-CM | POA: Diagnosis not present

## 2018-01-15 DIAGNOSIS — M545 Low back pain: Secondary | ICD-10-CM | POA: Diagnosis not present

## 2018-04-03 DIAGNOSIS — E1165 Type 2 diabetes mellitus with hyperglycemia: Secondary | ICD-10-CM | POA: Diagnosis not present

## 2018-04-03 DIAGNOSIS — Z23 Encounter for immunization: Secondary | ICD-10-CM | POA: Diagnosis not present

## 2018-04-03 DIAGNOSIS — Z0000 Encounter for general adult medical examination without abnormal findings: Secondary | ICD-10-CM | POA: Diagnosis not present

## 2018-04-03 DIAGNOSIS — E039 Hypothyroidism, unspecified: Secondary | ICD-10-CM | POA: Diagnosis not present

## 2018-04-03 DIAGNOSIS — I2510 Atherosclerotic heart disease of native coronary artery without angina pectoris: Secondary | ICD-10-CM | POA: Diagnosis not present

## 2018-04-03 DIAGNOSIS — E782 Mixed hyperlipidemia: Secondary | ICD-10-CM | POA: Diagnosis not present

## 2018-04-10 DIAGNOSIS — E039 Hypothyroidism, unspecified: Secondary | ICD-10-CM | POA: Diagnosis not present

## 2018-04-10 DIAGNOSIS — Z0001 Encounter for general adult medical examination with abnormal findings: Secondary | ICD-10-CM | POA: Diagnosis not present

## 2018-05-10 DIAGNOSIS — Z1211 Encounter for screening for malignant neoplasm of colon: Secondary | ICD-10-CM | POA: Diagnosis not present

## 2018-05-10 DIAGNOSIS — Z1212 Encounter for screening for malignant neoplasm of rectum: Secondary | ICD-10-CM | POA: Diagnosis not present

## 2018-06-02 DIAGNOSIS — J019 Acute sinusitis, unspecified: Secondary | ICD-10-CM | POA: Diagnosis not present

## 2018-07-24 DIAGNOSIS — E119 Type 2 diabetes mellitus without complications: Secondary | ICD-10-CM | POA: Diagnosis not present

## 2018-07-24 DIAGNOSIS — I10 Essential (primary) hypertension: Secondary | ICD-10-CM | POA: Diagnosis not present

## 2018-07-24 DIAGNOSIS — I252 Old myocardial infarction: Secondary | ICD-10-CM | POA: Diagnosis not present

## 2018-07-24 DIAGNOSIS — R001 Bradycardia, unspecified: Secondary | ICD-10-CM | POA: Diagnosis not present

## 2018-07-24 DIAGNOSIS — I25812 Atherosclerosis of bypass graft of coronary artery of transplanted heart without angina pectoris: Secondary | ICD-10-CM | POA: Diagnosis not present

## 2018-10-02 DIAGNOSIS — E782 Mixed hyperlipidemia: Secondary | ICD-10-CM | POA: Diagnosis not present

## 2018-10-02 DIAGNOSIS — I10 Essential (primary) hypertension: Secondary | ICD-10-CM | POA: Diagnosis not present

## 2018-10-02 DIAGNOSIS — E1165 Type 2 diabetes mellitus with hyperglycemia: Secondary | ICD-10-CM | POA: Diagnosis not present

## 2018-10-09 DIAGNOSIS — E782 Mixed hyperlipidemia: Secondary | ICD-10-CM | POA: Diagnosis not present

## 2018-10-09 DIAGNOSIS — I2510 Atherosclerotic heart disease of native coronary artery without angina pectoris: Secondary | ICD-10-CM | POA: Diagnosis not present

## 2018-10-09 DIAGNOSIS — E1165 Type 2 diabetes mellitus with hyperglycemia: Secondary | ICD-10-CM | POA: Diagnosis not present

## 2018-10-09 DIAGNOSIS — Z7189 Other specified counseling: Secondary | ICD-10-CM | POA: Diagnosis not present

## 2018-10-09 DIAGNOSIS — E039 Hypothyroidism, unspecified: Secondary | ICD-10-CM | POA: Diagnosis not present

## 2018-11-05 DIAGNOSIS — D485 Neoplasm of uncertain behavior of skin: Secondary | ICD-10-CM | POA: Diagnosis not present

## 2018-11-05 DIAGNOSIS — L820 Inflamed seborrheic keratosis: Secondary | ICD-10-CM | POA: Diagnosis not present

## 2018-11-05 DIAGNOSIS — D235 Other benign neoplasm of skin of trunk: Secondary | ICD-10-CM | POA: Diagnosis not present

## 2019-04-16 DIAGNOSIS — E1165 Type 2 diabetes mellitus with hyperglycemia: Secondary | ICD-10-CM | POA: Diagnosis not present

## 2019-04-16 DIAGNOSIS — E782 Mixed hyperlipidemia: Secondary | ICD-10-CM | POA: Diagnosis not present

## 2019-04-16 DIAGNOSIS — I2510 Atherosclerotic heart disease of native coronary artery without angina pectoris: Secondary | ICD-10-CM | POA: Diagnosis not present

## 2019-04-16 DIAGNOSIS — E039 Hypothyroidism, unspecified: Secondary | ICD-10-CM | POA: Diagnosis not present

## 2019-04-23 DIAGNOSIS — E1165 Type 2 diabetes mellitus with hyperglycemia: Secondary | ICD-10-CM | POA: Diagnosis not present

## 2019-04-23 DIAGNOSIS — I2510 Atherosclerotic heart disease of native coronary artery without angina pectoris: Secondary | ICD-10-CM | POA: Diagnosis not present

## 2019-04-23 DIAGNOSIS — E782 Mixed hyperlipidemia: Secondary | ICD-10-CM | POA: Diagnosis not present

## 2019-04-23 DIAGNOSIS — Z0000 Encounter for general adult medical examination without abnormal findings: Secondary | ICD-10-CM | POA: Diagnosis not present

## 2019-04-23 DIAGNOSIS — Z23 Encounter for immunization: Secondary | ICD-10-CM | POA: Diagnosis not present

## 2019-06-15 IMAGING — DX DG ANKLE COMPLETE 3+V*L*
3 series · 3 of 3 positions shown · non-contrast
Comparison: Left ankle series of April 22, 2017 and April 04, 2017

CLINICAL DATA: Follow of a left distal fibular fracture

EXAM:
LEFT ANKLE COMPLETE - 3+ VIEW

[ankle ap]
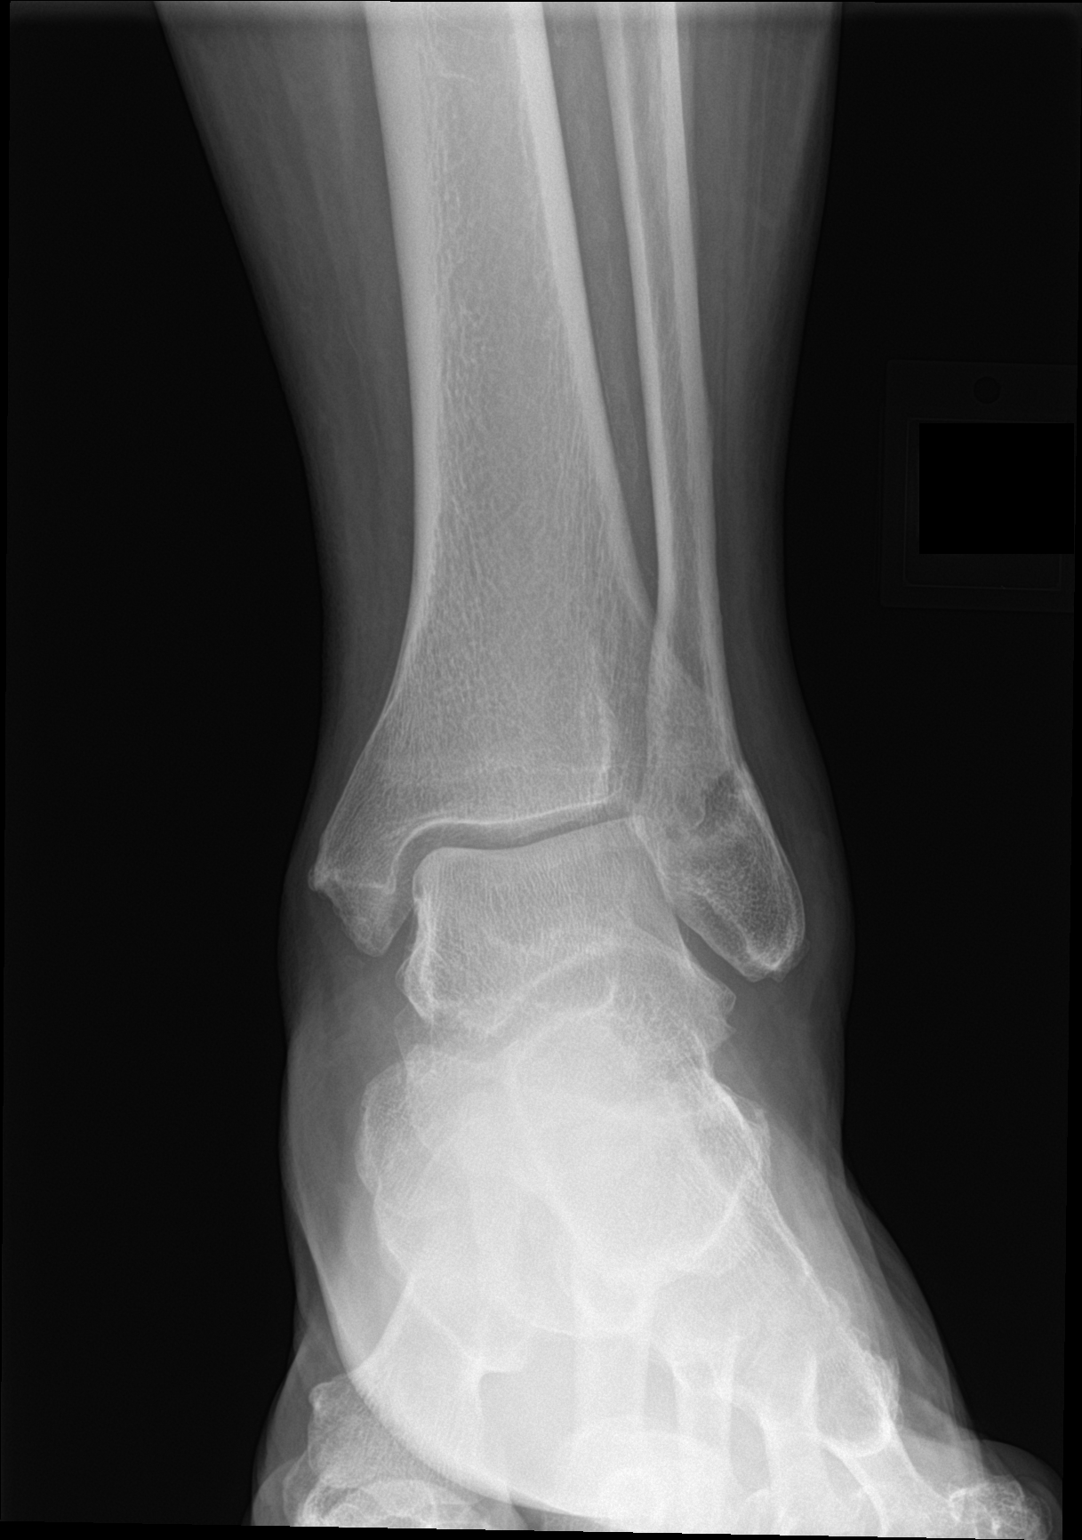

[ankle obl]
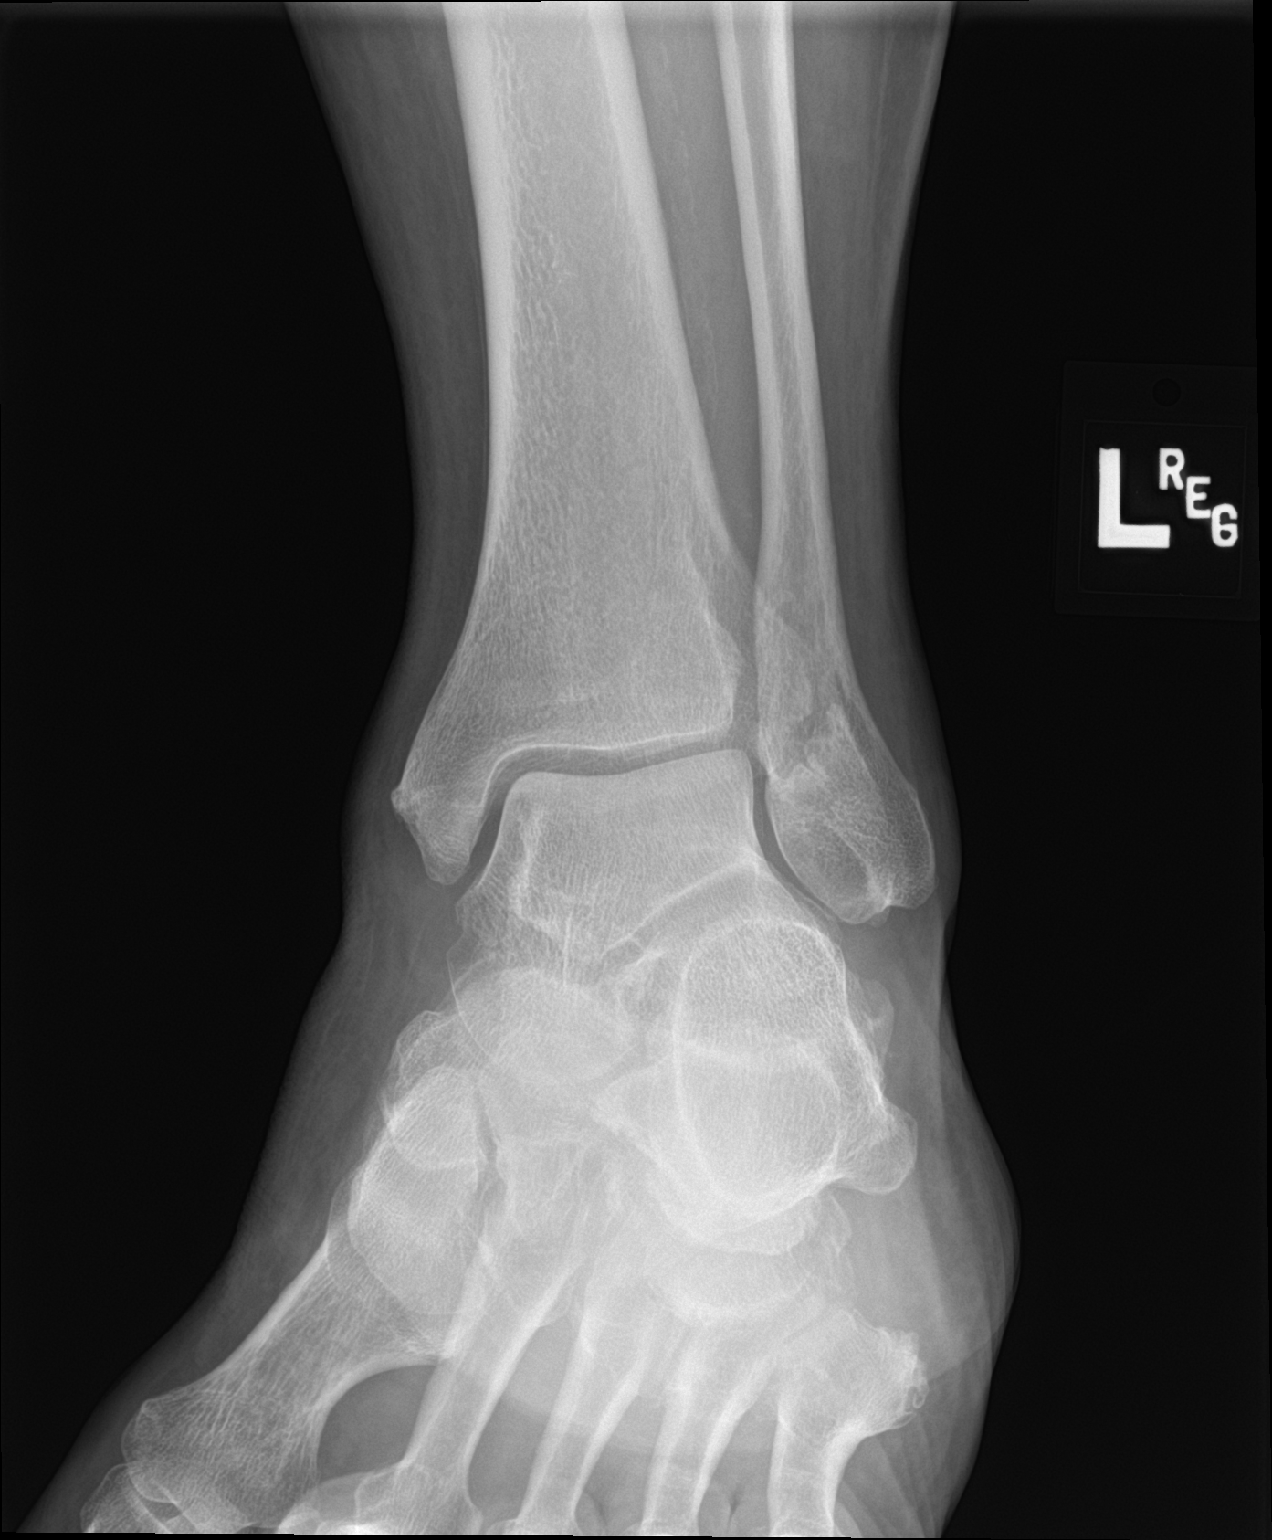

[ankle lat]
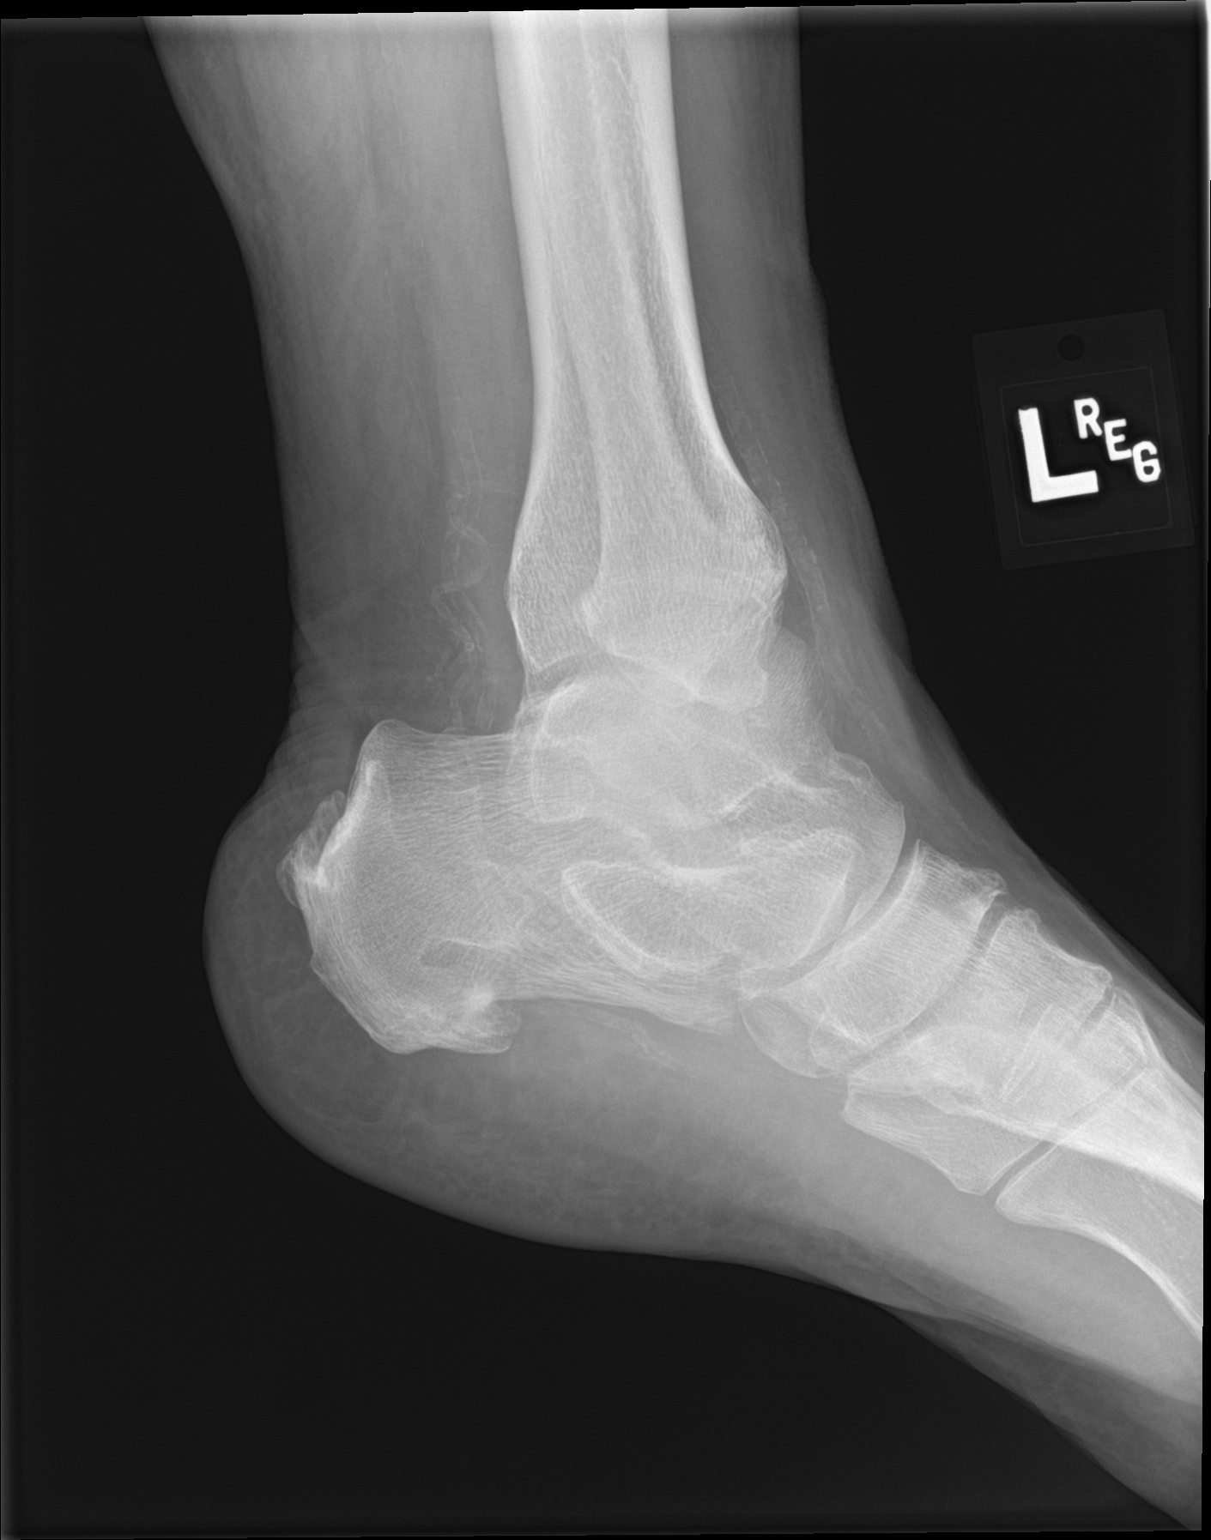

[3 of 3 positions shown; findings below may reference images not displayed]

FINDINGS: There is ongoing healing of the fracture of the distal left fibular
metaphysis. The fracture line remains visible however. The ankle
joint mortise is preserved. The talar dome is intact. The medial and
posterior malleolar I are normal. There are plantar and Achilles
region calcaneal spurs. There are arterial calcifications within the
foot. There is persistent soft tissue swelling medially and
laterally.
IMPRESSION: Ongoing but incomplete healing of the fracture the distal left
fibular metaphysis.

## 2019-07-27 DIAGNOSIS — E785 Hyperlipidemia, unspecified: Secondary | ICD-10-CM | POA: Diagnosis not present

## 2019-07-27 DIAGNOSIS — I252 Old myocardial infarction: Secondary | ICD-10-CM | POA: Diagnosis not present

## 2019-07-27 DIAGNOSIS — I10 Essential (primary) hypertension: Secondary | ICD-10-CM | POA: Diagnosis not present

## 2019-07-27 DIAGNOSIS — I2510 Atherosclerotic heart disease of native coronary artery without angina pectoris: Secondary | ICD-10-CM | POA: Diagnosis not present

## 2019-07-27 DIAGNOSIS — Z955 Presence of coronary angioplasty implant and graft: Secondary | ICD-10-CM | POA: Diagnosis not present

## 2019-08-12 DIAGNOSIS — R0600 Dyspnea, unspecified: Secondary | ICD-10-CM | POA: Diagnosis not present

## 2019-08-12 DIAGNOSIS — I2510 Atherosclerotic heart disease of native coronary artery without angina pectoris: Secondary | ICD-10-CM | POA: Diagnosis not present

## 2019-08-23 IMAGING — DX DG ANKLE COMPLETE 3+V*L*
3 series · 3 of 3 positions shown · non-contrast
Comparison: 04/04/2017 and 06/10/2017

CLINICAL DATA: Six week follow-up left ankle fracture.

EXAM:
LEFT ANKLE COMPLETE - 3+ VIEW

[ankle ap]
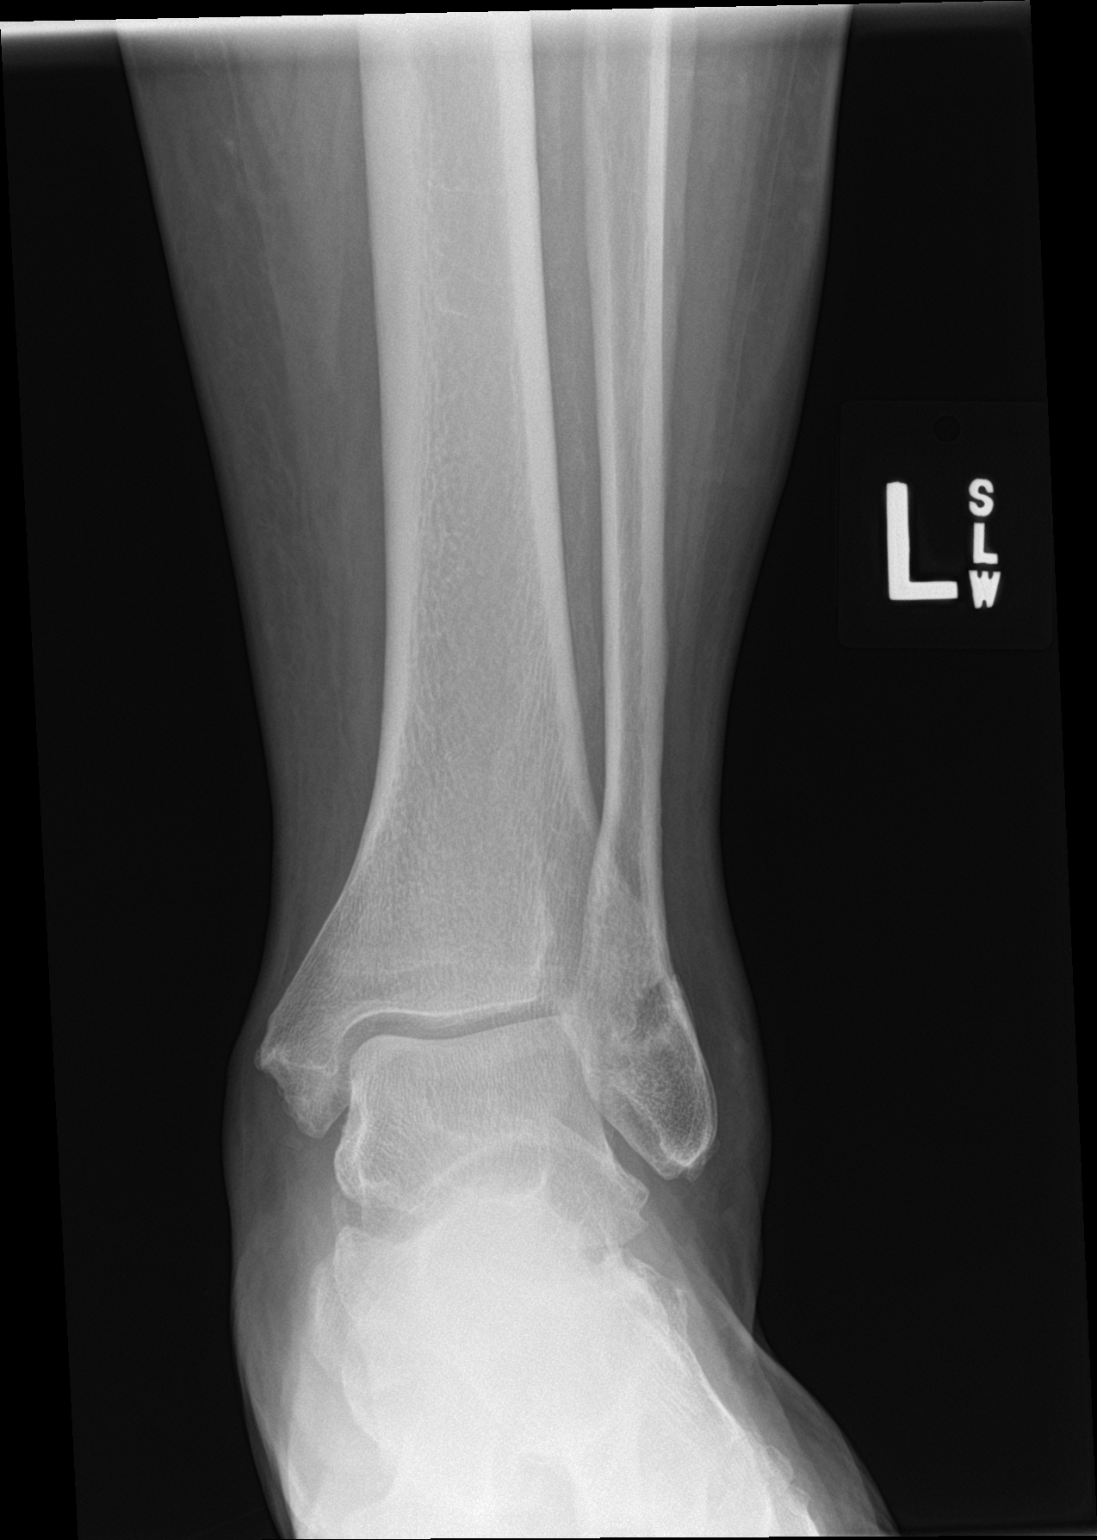

[ankle obl]
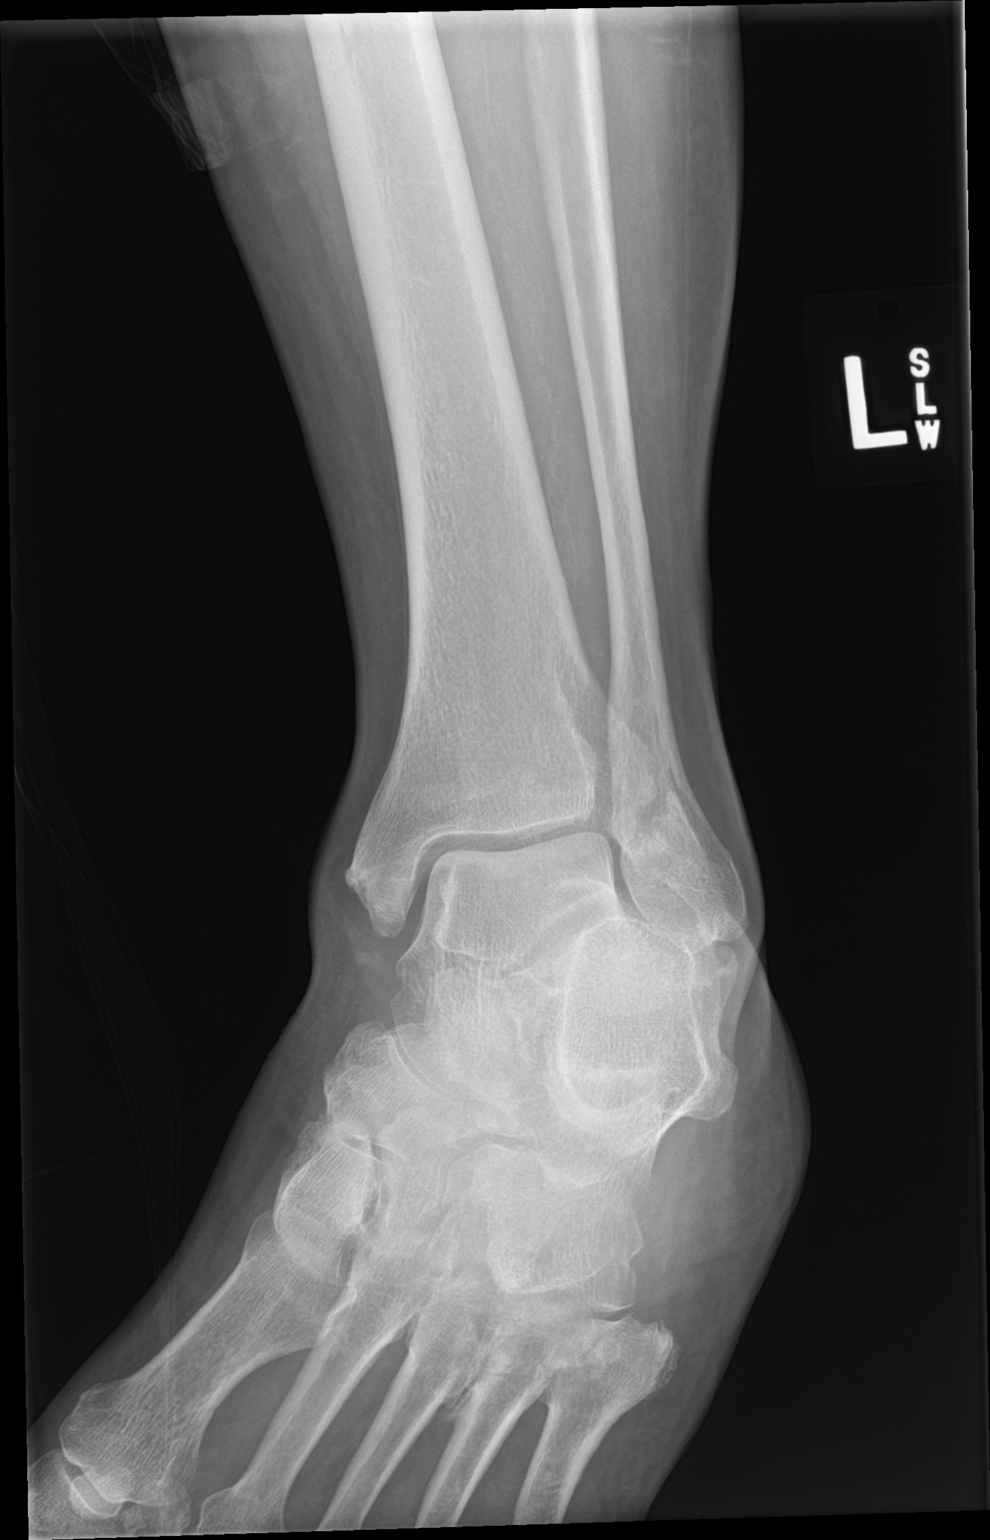

[ankle lat]
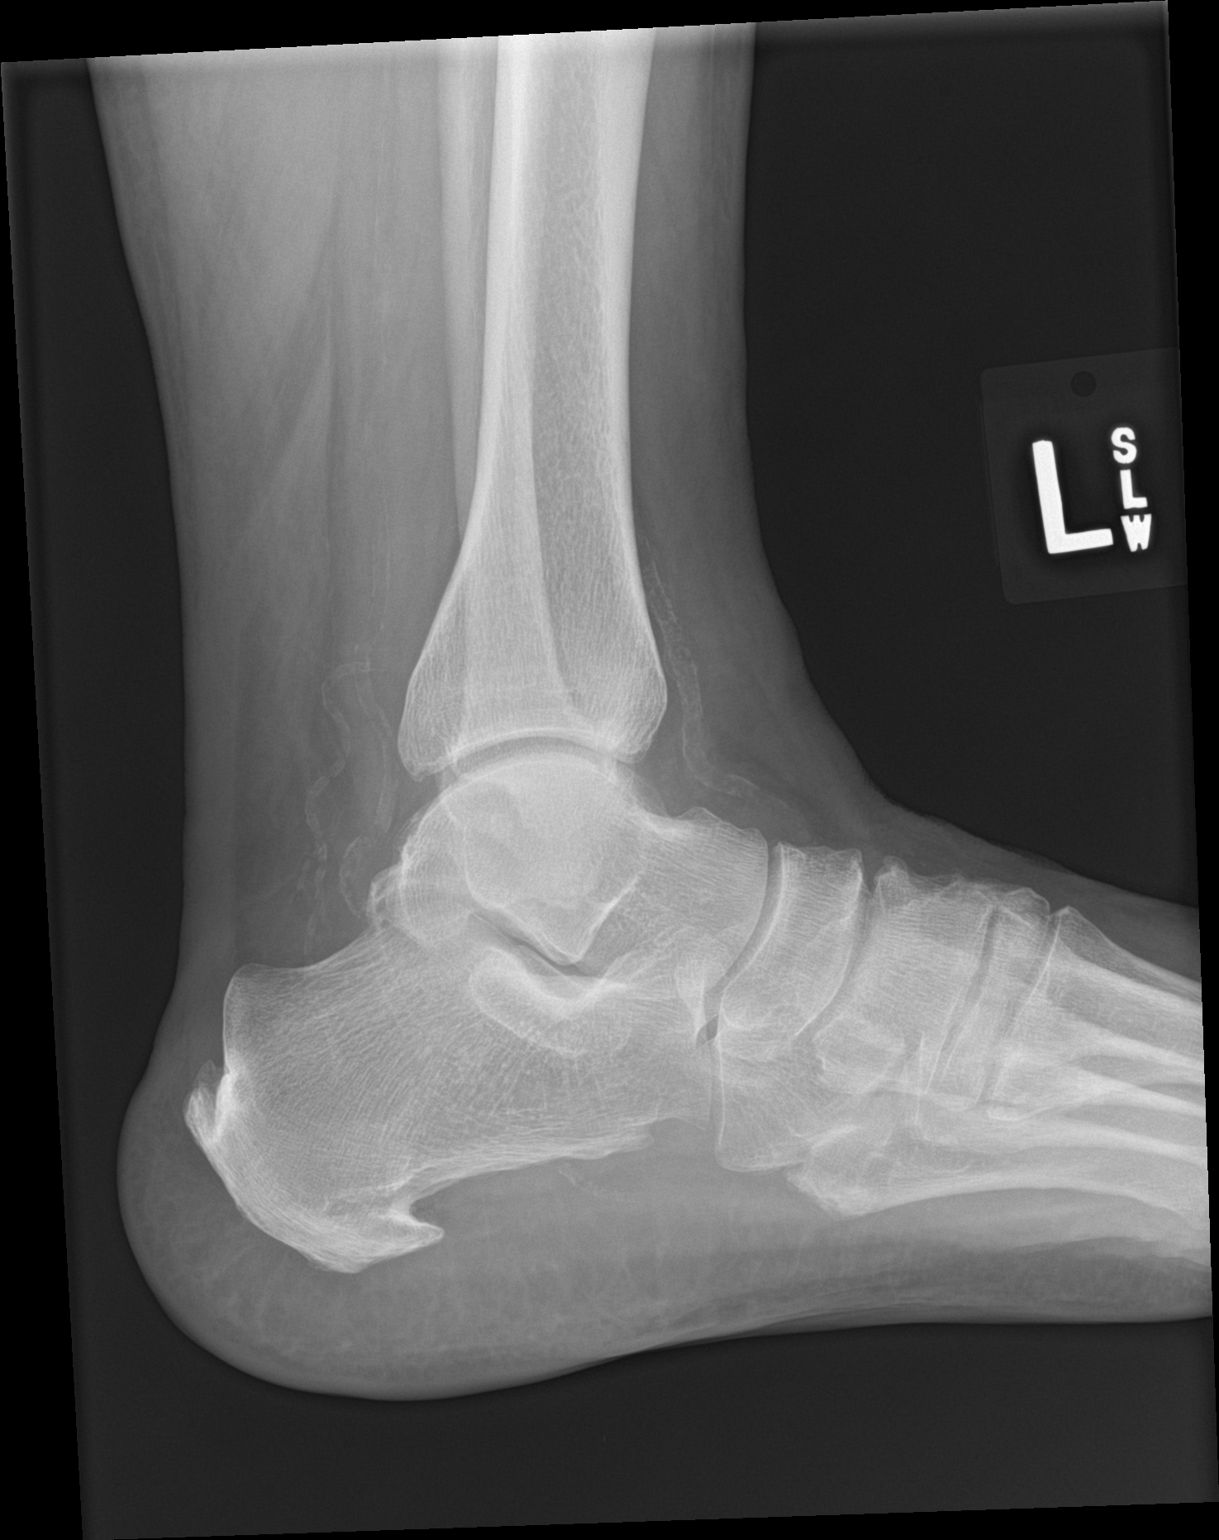

[3 of 3 positions shown; findings below may reference images not displayed]

FINDINGS: Findings demonstrate evidence of patient's distal left fibular
metaphyseal fracture with only subtle residual lateral cortical
irregularity. No acute fractures identified. Ankle mortise is within
normal. Remainder of the exam is unchanged.
IMPRESSION: Evidence of interval healing distal left fibular metaphyseal
fracture.

## 2019-09-10 DIAGNOSIS — I10 Essential (primary) hypertension: Secondary | ICD-10-CM | POA: Diagnosis not present

## 2019-09-10 DIAGNOSIS — E785 Hyperlipidemia, unspecified: Secondary | ICD-10-CM | POA: Diagnosis not present

## 2019-09-10 DIAGNOSIS — E119 Type 2 diabetes mellitus without complications: Secondary | ICD-10-CM | POA: Diagnosis not present

## 2019-09-10 DIAGNOSIS — I2510 Atherosclerotic heart disease of native coronary artery without angina pectoris: Secondary | ICD-10-CM | POA: Diagnosis not present

## 2019-09-10 DIAGNOSIS — I252 Old myocardial infarction: Secondary | ICD-10-CM | POA: Diagnosis not present

## 2019-09-15 DIAGNOSIS — I2510 Atherosclerotic heart disease of native coronary artery without angina pectoris: Secondary | ICD-10-CM | POA: Diagnosis not present

## 2019-10-22 DIAGNOSIS — I2510 Atherosclerotic heart disease of native coronary artery without angina pectoris: Secondary | ICD-10-CM | POA: Diagnosis not present

## 2019-10-22 DIAGNOSIS — I10 Essential (primary) hypertension: Secondary | ICD-10-CM | POA: Diagnosis not present

## 2019-10-22 DIAGNOSIS — I252 Old myocardial infarction: Secondary | ICD-10-CM | POA: Diagnosis not present

## 2019-10-22 DIAGNOSIS — R0600 Dyspnea, unspecified: Secondary | ICD-10-CM | POA: Diagnosis not present

## 2019-10-22 DIAGNOSIS — E119 Type 2 diabetes mellitus without complications: Secondary | ICD-10-CM | POA: Diagnosis not present

## 2019-10-26 DIAGNOSIS — R0600 Dyspnea, unspecified: Secondary | ICD-10-CM | POA: Diagnosis not present

## 2019-10-26 DIAGNOSIS — G4733 Obstructive sleep apnea (adult) (pediatric): Secondary | ICD-10-CM | POA: Diagnosis not present

## 2019-10-26 DIAGNOSIS — R911 Solitary pulmonary nodule: Secondary | ICD-10-CM | POA: Diagnosis not present

## 2019-10-29 DIAGNOSIS — I2510 Atherosclerotic heart disease of native coronary artery without angina pectoris: Secondary | ICD-10-CM | POA: Diagnosis not present

## 2019-10-29 DIAGNOSIS — E782 Mixed hyperlipidemia: Secondary | ICD-10-CM | POA: Diagnosis not present

## 2019-10-29 DIAGNOSIS — E1165 Type 2 diabetes mellitus with hyperglycemia: Secondary | ICD-10-CM | POA: Diagnosis not present

## 2019-10-31 DIAGNOSIS — R911 Solitary pulmonary nodule: Secondary | ICD-10-CM | POA: Diagnosis not present

## 2019-11-15 DIAGNOSIS — E782 Mixed hyperlipidemia: Secondary | ICD-10-CM | POA: Diagnosis not present

## 2019-11-15 DIAGNOSIS — E039 Hypothyroidism, unspecified: Secondary | ICD-10-CM | POA: Diagnosis not present

## 2019-11-15 DIAGNOSIS — I2510 Atherosclerotic heart disease of native coronary artery without angina pectoris: Secondary | ICD-10-CM | POA: Diagnosis not present

## 2019-11-15 DIAGNOSIS — E1165 Type 2 diabetes mellitus with hyperglycemia: Secondary | ICD-10-CM | POA: Diagnosis not present

## 2019-12-25 DIAGNOSIS — S92531B Displaced fracture of distal phalanx of right lesser toe(s), initial encounter for open fracture: Secondary | ICD-10-CM | POA: Diagnosis not present

## 2019-12-25 DIAGNOSIS — S92421A Displaced fracture of distal phalanx of right great toe, initial encounter for closed fracture: Secondary | ICD-10-CM | POA: Diagnosis not present

## 2019-12-25 DIAGNOSIS — W1839XA Other fall on same level, initial encounter: Secondary | ICD-10-CM | POA: Diagnosis not present

## 2019-12-25 DIAGNOSIS — Y998 Other external cause status: Secondary | ICD-10-CM | POA: Diagnosis not present

## 2019-12-25 DIAGNOSIS — S92421B Displaced fracture of distal phalanx of right great toe, initial encounter for open fracture: Secondary | ICD-10-CM | POA: Diagnosis not present

## 2019-12-25 DIAGNOSIS — S92321A Displaced fracture of second metatarsal bone, right foot, initial encounter for closed fracture: Secondary | ICD-10-CM | POA: Diagnosis not present

## 2019-12-25 DIAGNOSIS — Z7901 Long term (current) use of anticoagulants: Secondary | ICD-10-CM | POA: Diagnosis not present

## 2019-12-25 DIAGNOSIS — Z23 Encounter for immunization: Secondary | ICD-10-CM | POA: Diagnosis not present

## 2019-12-25 DIAGNOSIS — S90931A Unspecified superficial injury of right great toe, initial encounter: Secondary | ICD-10-CM | POA: Diagnosis not present

## 2019-12-26 DIAGNOSIS — T148XXA Other injury of unspecified body region, initial encounter: Secondary | ICD-10-CM | POA: Diagnosis not present

## 2019-12-26 DIAGNOSIS — S92501A Displaced unspecified fracture of right lesser toe(s), initial encounter for closed fracture: Secondary | ICD-10-CM | POA: Diagnosis not present

## 2019-12-26 DIAGNOSIS — S92321A Displaced fracture of second metatarsal bone, right foot, initial encounter for closed fracture: Secondary | ICD-10-CM | POA: Diagnosis not present

## 2019-12-26 DIAGNOSIS — W208XXA Other cause of strike by thrown, projected or falling object, initial encounter: Secondary | ICD-10-CM | POA: Diagnosis not present

## 2019-12-26 DIAGNOSIS — Z23 Encounter for immunization: Secondary | ICD-10-CM | POA: Diagnosis not present

## 2019-12-26 DIAGNOSIS — Y998 Other external cause status: Secondary | ICD-10-CM | POA: Diagnosis not present

## 2019-12-26 DIAGNOSIS — Y999 Unspecified external cause status: Secondary | ICD-10-CM | POA: Diagnosis not present

## 2019-12-26 DIAGNOSIS — S92421B Displaced fracture of distal phalanx of right great toe, initial encounter for open fracture: Secondary | ICD-10-CM | POA: Diagnosis not present

## 2019-12-26 DIAGNOSIS — S92911B Unspecified fracture of right toe(s), initial encounter for open fracture: Secondary | ICD-10-CM | POA: Diagnosis not present

## 2019-12-26 DIAGNOSIS — W1839XA Other fall on same level, initial encounter: Secondary | ICD-10-CM | POA: Diagnosis not present

## 2019-12-26 DIAGNOSIS — S92531B Displaced fracture of distal phalanx of right lesser toe(s), initial encounter for open fracture: Secondary | ICD-10-CM | POA: Diagnosis not present

## 2019-12-26 DIAGNOSIS — S92421A Displaced fracture of distal phalanx of right great toe, initial encounter for closed fracture: Secondary | ICD-10-CM | POA: Diagnosis not present

## 2019-12-27 DIAGNOSIS — S91109A Unspecified open wound of unspecified toe(s) without damage to nail, initial encounter: Secondary | ICD-10-CM | POA: Diagnosis not present

## 2019-12-27 DIAGNOSIS — S9781XA Crushing injury of right foot, initial encounter: Secondary | ICD-10-CM | POA: Diagnosis not present

## 2020-01-07 DIAGNOSIS — S9781XA Crushing injury of right foot, initial encounter: Secondary | ICD-10-CM | POA: Diagnosis not present

## 2020-01-12 DIAGNOSIS — I96 Gangrene, not elsewhere classified: Secondary | ICD-10-CM | POA: Diagnosis not present

## 2020-01-12 DIAGNOSIS — S9781XA Crushing injury of right foot, initial encounter: Secondary | ICD-10-CM | POA: Diagnosis not present

## 2020-01-12 DIAGNOSIS — E119 Type 2 diabetes mellitus without complications: Secondary | ICD-10-CM | POA: Diagnosis not present

## 2020-01-12 DIAGNOSIS — Z794 Long term (current) use of insulin: Secondary | ICD-10-CM | POA: Diagnosis not present

## 2020-02-28 DIAGNOSIS — Z7409 Other reduced mobility: Secondary | ICD-10-CM | POA: Diagnosis not present

## 2020-02-28 DIAGNOSIS — S9781XA Crushing injury of right foot, initial encounter: Secondary | ICD-10-CM | POA: Diagnosis not present

## 2020-02-28 DIAGNOSIS — R29898 Other symptoms and signs involving the musculoskeletal system: Secondary | ICD-10-CM | POA: Diagnosis not present

## 2020-02-28 DIAGNOSIS — Z9889 Other specified postprocedural states: Secondary | ICD-10-CM | POA: Diagnosis not present

## 2020-03-10 DIAGNOSIS — S9781XA Crushing injury of right foot, initial encounter: Secondary | ICD-10-CM | POA: Diagnosis not present

## 2020-03-10 DIAGNOSIS — Z9889 Other specified postprocedural states: Secondary | ICD-10-CM | POA: Diagnosis not present

## 2020-03-10 DIAGNOSIS — Z7409 Other reduced mobility: Secondary | ICD-10-CM | POA: Diagnosis not present

## 2020-03-10 DIAGNOSIS — R29898 Other symptoms and signs involving the musculoskeletal system: Secondary | ICD-10-CM | POA: Diagnosis not present

## 2020-03-17 DIAGNOSIS — R29898 Other symptoms and signs involving the musculoskeletal system: Secondary | ICD-10-CM | POA: Diagnosis not present

## 2020-03-17 DIAGNOSIS — S9781XA Crushing injury of right foot, initial encounter: Secondary | ICD-10-CM | POA: Diagnosis not present

## 2020-03-17 DIAGNOSIS — Z7409 Other reduced mobility: Secondary | ICD-10-CM | POA: Diagnosis not present

## 2020-03-17 DIAGNOSIS — Z9889 Other specified postprocedural states: Secondary | ICD-10-CM | POA: Diagnosis not present

## 2020-03-22 DIAGNOSIS — Z7409 Other reduced mobility: Secondary | ICD-10-CM | POA: Diagnosis not present

## 2020-03-22 DIAGNOSIS — S9781XA Crushing injury of right foot, initial encounter: Secondary | ICD-10-CM | POA: Diagnosis not present

## 2020-03-22 DIAGNOSIS — R29898 Other symptoms and signs involving the musculoskeletal system: Secondary | ICD-10-CM | POA: Diagnosis not present

## 2020-03-22 DIAGNOSIS — Z9889 Other specified postprocedural states: Secondary | ICD-10-CM | POA: Diagnosis not present

## 2020-03-29 DIAGNOSIS — Z9889 Other specified postprocedural states: Secondary | ICD-10-CM | POA: Diagnosis not present

## 2020-03-29 DIAGNOSIS — S9781XA Crushing injury of right foot, initial encounter: Secondary | ICD-10-CM | POA: Diagnosis not present

## 2020-03-29 DIAGNOSIS — Z7409 Other reduced mobility: Secondary | ICD-10-CM | POA: Diagnosis not present

## 2020-03-29 DIAGNOSIS — R29898 Other symptoms and signs involving the musculoskeletal system: Secondary | ICD-10-CM | POA: Diagnosis not present

## 2020-04-21 DIAGNOSIS — E1165 Type 2 diabetes mellitus with hyperglycemia: Secondary | ICD-10-CM | POA: Diagnosis not present

## 2020-04-21 DIAGNOSIS — E782 Mixed hyperlipidemia: Secondary | ICD-10-CM | POA: Diagnosis not present

## 2020-04-21 DIAGNOSIS — E039 Hypothyroidism, unspecified: Secondary | ICD-10-CM | POA: Diagnosis not present

## 2020-04-21 DIAGNOSIS — I2510 Atherosclerotic heart disease of native coronary artery without angina pectoris: Secondary | ICD-10-CM | POA: Diagnosis not present

## 2020-04-21 DIAGNOSIS — Z0000 Encounter for general adult medical examination without abnormal findings: Secondary | ICD-10-CM | POA: Diagnosis not present

## 2020-05-04 DIAGNOSIS — Z794 Long term (current) use of insulin: Secondary | ICD-10-CM | POA: Diagnosis not present

## 2020-05-04 DIAGNOSIS — Z0000 Encounter for general adult medical examination without abnormal findings: Secondary | ICD-10-CM | POA: Diagnosis not present

## 2020-05-04 DIAGNOSIS — I2510 Atherosclerotic heart disease of native coronary artery without angina pectoris: Secondary | ICD-10-CM | POA: Diagnosis not present

## 2020-05-04 DIAGNOSIS — Z23 Encounter for immunization: Secondary | ICD-10-CM | POA: Diagnosis not present

## 2020-05-04 DIAGNOSIS — E1165 Type 2 diabetes mellitus with hyperglycemia: Secondary | ICD-10-CM | POA: Diagnosis not present

## 2020-05-18 DIAGNOSIS — I2510 Atherosclerotic heart disease of native coronary artery without angina pectoris: Secondary | ICD-10-CM | POA: Diagnosis not present

## 2020-05-18 DIAGNOSIS — I252 Old myocardial infarction: Secondary | ICD-10-CM | POA: Diagnosis not present

## 2020-05-18 DIAGNOSIS — I10 Essential (primary) hypertension: Secondary | ICD-10-CM | POA: Diagnosis not present

## 2020-05-18 DIAGNOSIS — E785 Hyperlipidemia, unspecified: Secondary | ICD-10-CM | POA: Diagnosis not present

## 2020-05-18 DIAGNOSIS — Z955 Presence of coronary angioplasty implant and graft: Secondary | ICD-10-CM | POA: Diagnosis not present

## 2020-08-18 DIAGNOSIS — E782 Mixed hyperlipidemia: Secondary | ICD-10-CM | POA: Diagnosis not present

## 2020-08-18 DIAGNOSIS — I2510 Atherosclerotic heart disease of native coronary artery without angina pectoris: Secondary | ICD-10-CM | POA: Diagnosis not present

## 2020-08-18 DIAGNOSIS — E1165 Type 2 diabetes mellitus with hyperglycemia: Secondary | ICD-10-CM | POA: Diagnosis not present

## 2020-08-18 DIAGNOSIS — E039 Hypothyroidism, unspecified: Secondary | ICD-10-CM | POA: Diagnosis not present

## 2020-08-25 DIAGNOSIS — E1165 Type 2 diabetes mellitus with hyperglycemia: Secondary | ICD-10-CM | POA: Diagnosis not present

## 2020-08-25 DIAGNOSIS — E782 Mixed hyperlipidemia: Secondary | ICD-10-CM | POA: Diagnosis not present

## 2020-08-25 DIAGNOSIS — I2510 Atherosclerotic heart disease of native coronary artery without angina pectoris: Secondary | ICD-10-CM | POA: Diagnosis not present

## 2020-11-21 DIAGNOSIS — I2510 Atherosclerotic heart disease of native coronary artery without angina pectoris: Secondary | ICD-10-CM | POA: Diagnosis not present

## 2020-11-21 DIAGNOSIS — Z955 Presence of coronary angioplasty implant and graft: Secondary | ICD-10-CM | POA: Diagnosis not present

## 2020-11-21 DIAGNOSIS — I252 Old myocardial infarction: Secondary | ICD-10-CM | POA: Diagnosis not present

## 2020-11-21 DIAGNOSIS — E785 Hyperlipidemia, unspecified: Secondary | ICD-10-CM | POA: Diagnosis not present

## 2020-11-21 DIAGNOSIS — I10 Essential (primary) hypertension: Secondary | ICD-10-CM | POA: Diagnosis not present

## 2020-11-30 DIAGNOSIS — I10 Essential (primary) hypertension: Secondary | ICD-10-CM | POA: Diagnosis not present

## 2020-11-30 DIAGNOSIS — E1165 Type 2 diabetes mellitus with hyperglycemia: Secondary | ICD-10-CM | POA: Diagnosis not present

## 2020-11-30 DIAGNOSIS — Z794 Long term (current) use of insulin: Secondary | ICD-10-CM | POA: Diagnosis not present

## 2020-11-30 DIAGNOSIS — E782 Mixed hyperlipidemia: Secondary | ICD-10-CM | POA: Diagnosis not present

## 2021-01-31 DIAGNOSIS — M1712 Unilateral primary osteoarthritis, left knee: Secondary | ICD-10-CM | POA: Diagnosis not present

## 2021-01-31 DIAGNOSIS — E1165 Type 2 diabetes mellitus with hyperglycemia: Secondary | ICD-10-CM | POA: Diagnosis not present

## 2021-01-31 DIAGNOSIS — Z794 Long term (current) use of insulin: Secondary | ICD-10-CM | POA: Diagnosis not present

## 2021-01-31 DIAGNOSIS — I10 Essential (primary) hypertension: Secondary | ICD-10-CM | POA: Diagnosis not present

## 2021-01-31 DIAGNOSIS — E782 Mixed hyperlipidemia: Secondary | ICD-10-CM | POA: Diagnosis not present

## 2021-02-02 DIAGNOSIS — E782 Mixed hyperlipidemia: Secondary | ICD-10-CM | POA: Diagnosis not present

## 2021-02-02 DIAGNOSIS — E1165 Type 2 diabetes mellitus with hyperglycemia: Secondary | ICD-10-CM | POA: Diagnosis not present

## 2021-02-09 DIAGNOSIS — E1165 Type 2 diabetes mellitus with hyperglycemia: Secondary | ICD-10-CM | POA: Diagnosis not present

## 2021-02-09 DIAGNOSIS — E11649 Type 2 diabetes mellitus with hypoglycemia without coma: Secondary | ICD-10-CM | POA: Diagnosis not present

## 2021-02-09 DIAGNOSIS — Z794 Long term (current) use of insulin: Secondary | ICD-10-CM | POA: Diagnosis not present

## 2021-02-09 DIAGNOSIS — I10 Essential (primary) hypertension: Secondary | ICD-10-CM | POA: Diagnosis not present

## 2021-02-09 DIAGNOSIS — Z23 Encounter for immunization: Secondary | ICD-10-CM | POA: Diagnosis not present

## 2021-02-09 DIAGNOSIS — E782 Mixed hyperlipidemia: Secondary | ICD-10-CM | POA: Diagnosis not present

## 2021-02-09 DIAGNOSIS — E039 Hypothyroidism, unspecified: Secondary | ICD-10-CM | POA: Diagnosis not present

## 2021-03-02 DIAGNOSIS — E782 Mixed hyperlipidemia: Secondary | ICD-10-CM | POA: Diagnosis not present

## 2021-03-02 DIAGNOSIS — I10 Essential (primary) hypertension: Secondary | ICD-10-CM | POA: Diagnosis not present

## 2021-03-02 DIAGNOSIS — E1165 Type 2 diabetes mellitus with hyperglycemia: Secondary | ICD-10-CM | POA: Diagnosis not present

## 2021-03-02 DIAGNOSIS — Z794 Long term (current) use of insulin: Secondary | ICD-10-CM | POA: Diagnosis not present

## 2021-04-02 DIAGNOSIS — E1165 Type 2 diabetes mellitus with hyperglycemia: Secondary | ICD-10-CM | POA: Diagnosis not present

## 2021-04-02 DIAGNOSIS — Z794 Long term (current) use of insulin: Secondary | ICD-10-CM | POA: Diagnosis not present

## 2021-04-02 DIAGNOSIS — E782 Mixed hyperlipidemia: Secondary | ICD-10-CM | POA: Diagnosis not present

## 2021-04-02 DIAGNOSIS — I10 Essential (primary) hypertension: Secondary | ICD-10-CM | POA: Diagnosis not present

## 2021-05-02 DIAGNOSIS — E1165 Type 2 diabetes mellitus with hyperglycemia: Secondary | ICD-10-CM | POA: Diagnosis not present

## 2021-05-02 DIAGNOSIS — E782 Mixed hyperlipidemia: Secondary | ICD-10-CM | POA: Diagnosis not present

## 2021-05-02 DIAGNOSIS — Z794 Long term (current) use of insulin: Secondary | ICD-10-CM | POA: Diagnosis not present

## 2021-05-02 DIAGNOSIS — I10 Essential (primary) hypertension: Secondary | ICD-10-CM | POA: Diagnosis not present

## 2021-06-01 DIAGNOSIS — E1165 Type 2 diabetes mellitus with hyperglycemia: Secondary | ICD-10-CM | POA: Diagnosis not present

## 2021-06-01 DIAGNOSIS — E782 Mixed hyperlipidemia: Secondary | ICD-10-CM | POA: Diagnosis not present

## 2021-06-01 DIAGNOSIS — I10 Essential (primary) hypertension: Secondary | ICD-10-CM | POA: Diagnosis not present

## 2021-06-14 DIAGNOSIS — E039 Hypothyroidism, unspecified: Secondary | ICD-10-CM | POA: Diagnosis not present

## 2021-06-14 DIAGNOSIS — E782 Mixed hyperlipidemia: Secondary | ICD-10-CM | POA: Diagnosis not present

## 2021-06-14 DIAGNOSIS — E1165 Type 2 diabetes mellitus with hyperglycemia: Secondary | ICD-10-CM | POA: Diagnosis not present

## 2021-06-14 DIAGNOSIS — Z794 Long term (current) use of insulin: Secondary | ICD-10-CM | POA: Diagnosis not present

## 2021-06-14 DIAGNOSIS — Z0000 Encounter for general adult medical examination without abnormal findings: Secondary | ICD-10-CM | POA: Diagnosis not present

## 2021-06-14 DIAGNOSIS — I952 Hypotension due to drugs: Secondary | ICD-10-CM | POA: Diagnosis not present

## 2021-06-14 DIAGNOSIS — R5383 Other fatigue: Secondary | ICD-10-CM | POA: Diagnosis not present

## 2021-06-14 DIAGNOSIS — I10 Essential (primary) hypertension: Secondary | ICD-10-CM | POA: Diagnosis not present

## 2021-06-16 DIAGNOSIS — I252 Old myocardial infarction: Secondary | ICD-10-CM | POA: Diagnosis not present

## 2021-06-16 DIAGNOSIS — I2510 Atherosclerotic heart disease of native coronary artery without angina pectoris: Secondary | ICD-10-CM | POA: Diagnosis not present

## 2021-06-16 DIAGNOSIS — I509 Heart failure, unspecified: Secondary | ICD-10-CM | POA: Diagnosis not present

## 2021-06-16 DIAGNOSIS — R590 Localized enlarged lymph nodes: Secondary | ICD-10-CM | POA: Diagnosis not present

## 2021-06-16 DIAGNOSIS — Z79899 Other long term (current) drug therapy: Secondary | ICD-10-CM | POA: Diagnosis not present

## 2021-06-16 DIAGNOSIS — Z794 Long term (current) use of insulin: Secondary | ICD-10-CM | POA: Diagnosis not present

## 2021-06-16 DIAGNOSIS — Z7984 Long term (current) use of oral hypoglycemic drugs: Secondary | ICD-10-CM | POA: Diagnosis not present

## 2021-06-16 DIAGNOSIS — Z9049 Acquired absence of other specified parts of digestive tract: Secondary | ICD-10-CM | POA: Diagnosis not present

## 2021-06-16 DIAGNOSIS — I10 Essential (primary) hypertension: Secondary | ICD-10-CM | POA: Diagnosis not present

## 2021-06-16 DIAGNOSIS — R058 Other specified cough: Secondary | ICD-10-CM | POA: Diagnosis not present

## 2021-06-16 DIAGNOSIS — I700 Atherosclerosis of aorta: Secondary | ICD-10-CM | POA: Diagnosis not present

## 2021-06-16 DIAGNOSIS — E7800 Pure hypercholesterolemia, unspecified: Secondary | ICD-10-CM | POA: Diagnosis not present

## 2021-06-16 DIAGNOSIS — Z955 Presence of coronary angioplasty implant and graft: Secondary | ICD-10-CM | POA: Diagnosis not present

## 2021-06-16 DIAGNOSIS — I110 Hypertensive heart disease with heart failure: Secondary | ICD-10-CM | POA: Diagnosis not present

## 2021-06-16 DIAGNOSIS — E11649 Type 2 diabetes mellitus with hypoglycemia without coma: Secondary | ICD-10-CM | POA: Diagnosis not present

## 2021-06-16 DIAGNOSIS — M19012 Primary osteoarthritis, left shoulder: Secondary | ICD-10-CM | POA: Diagnosis not present

## 2021-06-16 DIAGNOSIS — I25119 Atherosclerotic heart disease of native coronary artery with unspecified angina pectoris: Secondary | ICD-10-CM | POA: Diagnosis not present

## 2021-06-16 DIAGNOSIS — R079 Chest pain, unspecified: Secondary | ICD-10-CM | POA: Diagnosis not present

## 2021-06-17 DIAGNOSIS — Z9049 Acquired absence of other specified parts of digestive tract: Secondary | ICD-10-CM | POA: Diagnosis not present

## 2021-06-17 DIAGNOSIS — C4002 Malignant neoplasm of scapula and long bones of left upper limb: Secondary | ICD-10-CM | POA: Diagnosis not present

## 2021-06-17 DIAGNOSIS — R079 Chest pain, unspecified: Secondary | ICD-10-CM | POA: Diagnosis not present

## 2021-06-17 DIAGNOSIS — E119 Type 2 diabetes mellitus without complications: Secondary | ICD-10-CM | POA: Diagnosis not present

## 2021-06-17 DIAGNOSIS — Z955 Presence of coronary angioplasty implant and graft: Secondary | ICD-10-CM | POA: Diagnosis not present

## 2021-06-17 DIAGNOSIS — I10 Essential (primary) hypertension: Secondary | ICD-10-CM | POA: Diagnosis not present

## 2021-06-17 DIAGNOSIS — E785 Hyperlipidemia, unspecified: Secondary | ICD-10-CM | POA: Diagnosis not present

## 2021-06-17 DIAGNOSIS — J40 Bronchitis, not specified as acute or chronic: Secondary | ICD-10-CM | POA: Diagnosis not present

## 2021-06-17 DIAGNOSIS — R0789 Other chest pain: Secondary | ICD-10-CM | POA: Diagnosis not present

## 2021-06-18 DIAGNOSIS — Z8679 Personal history of other diseases of the circulatory system: Secondary | ICD-10-CM | POA: Diagnosis not present

## 2021-06-18 DIAGNOSIS — E119 Type 2 diabetes mellitus without complications: Secondary | ICD-10-CM | POA: Diagnosis not present

## 2021-06-18 DIAGNOSIS — R0781 Pleurodynia: Secondary | ICD-10-CM | POA: Diagnosis not present

## 2021-06-18 DIAGNOSIS — R001 Bradycardia, unspecified: Secondary | ICD-10-CM | POA: Diagnosis not present

## 2021-06-18 DIAGNOSIS — I509 Heart failure, unspecified: Secondary | ICD-10-CM | POA: Diagnosis not present

## 2021-06-18 DIAGNOSIS — C4002 Malignant neoplasm of scapula and long bones of left upper limb: Secondary | ICD-10-CM | POA: Diagnosis not present

## 2021-06-18 DIAGNOSIS — R079 Chest pain, unspecified: Secondary | ICD-10-CM | POA: Diagnosis not present

## 2021-06-18 DIAGNOSIS — E785 Hyperlipidemia, unspecified: Secondary | ICD-10-CM | POA: Diagnosis not present

## 2021-06-18 DIAGNOSIS — I25119 Atherosclerotic heart disease of native coronary artery with unspecified angina pectoris: Secondary | ICD-10-CM | POA: Diagnosis not present

## 2021-06-18 DIAGNOSIS — I110 Hypertensive heart disease with heart failure: Secondary | ICD-10-CM | POA: Diagnosis not present

## 2021-06-18 DIAGNOSIS — Z794 Long term (current) use of insulin: Secondary | ICD-10-CM | POA: Diagnosis not present

## 2021-06-18 DIAGNOSIS — Z955 Presence of coronary angioplasty implant and graft: Secondary | ICD-10-CM | POA: Diagnosis not present

## 2021-06-19 DIAGNOSIS — I10 Essential (primary) hypertension: Secondary | ICD-10-CM | POA: Diagnosis not present

## 2021-06-19 DIAGNOSIS — E785 Hyperlipidemia, unspecified: Secondary | ICD-10-CM | POA: Diagnosis not present

## 2021-06-19 DIAGNOSIS — R222 Localized swelling, mass and lump, trunk: Secondary | ICD-10-CM | POA: Diagnosis not present

## 2021-06-19 DIAGNOSIS — E119 Type 2 diabetes mellitus without complications: Secondary | ICD-10-CM | POA: Diagnosis not present

## 2021-06-19 DIAGNOSIS — Z794 Long term (current) use of insulin: Secondary | ICD-10-CM | POA: Diagnosis not present

## 2021-06-26 DIAGNOSIS — Z0000 Encounter for general adult medical examination without abnormal findings: Secondary | ICD-10-CM | POA: Diagnosis not present

## 2021-06-26 DIAGNOSIS — E1121 Type 2 diabetes mellitus with diabetic nephropathy: Secondary | ICD-10-CM | POA: Diagnosis not present

## 2021-06-26 DIAGNOSIS — S98131D Complete traumatic amputation of one right lesser toe, subsequent encounter: Secondary | ICD-10-CM | POA: Diagnosis not present

## 2021-06-26 DIAGNOSIS — Z794 Long term (current) use of insulin: Secondary | ICD-10-CM | POA: Diagnosis not present

## 2021-06-26 DIAGNOSIS — E1165 Type 2 diabetes mellitus with hyperglycemia: Secondary | ICD-10-CM | POA: Diagnosis not present

## 2021-06-26 DIAGNOSIS — E11649 Type 2 diabetes mellitus with hypoglycemia without coma: Secondary | ICD-10-CM | POA: Diagnosis not present

## 2021-06-26 DIAGNOSIS — I10 Essential (primary) hypertension: Secondary | ICD-10-CM | POA: Diagnosis not present

## 2021-06-26 DIAGNOSIS — I2510 Atherosclerotic heart disease of native coronary artery without angina pectoris: Secondary | ICD-10-CM | POA: Diagnosis not present

## 2021-06-26 DIAGNOSIS — R222 Localized swelling, mass and lump, trunk: Secondary | ICD-10-CM | POA: Diagnosis not present

## 2021-06-26 DIAGNOSIS — E782 Mixed hyperlipidemia: Secondary | ICD-10-CM | POA: Diagnosis not present

## 2021-07-03 DIAGNOSIS — E782 Mixed hyperlipidemia: Secondary | ICD-10-CM | POA: Diagnosis not present

## 2021-07-03 DIAGNOSIS — I10 Essential (primary) hypertension: Secondary | ICD-10-CM | POA: Diagnosis not present

## 2021-07-03 DIAGNOSIS — E1165 Type 2 diabetes mellitus with hyperglycemia: Secondary | ICD-10-CM | POA: Diagnosis not present

## 2021-07-03 DIAGNOSIS — Z794 Long term (current) use of insulin: Secondary | ICD-10-CM | POA: Diagnosis not present

## 2021-07-19 DIAGNOSIS — D171 Benign lipomatous neoplasm of skin and subcutaneous tissue of trunk: Secondary | ICD-10-CM | POA: Diagnosis not present

## 2021-07-23 DIAGNOSIS — E1165 Type 2 diabetes mellitus with hyperglycemia: Secondary | ICD-10-CM | POA: Diagnosis not present

## 2021-07-23 DIAGNOSIS — E782 Mixed hyperlipidemia: Secondary | ICD-10-CM | POA: Diagnosis not present

## 2021-07-23 DIAGNOSIS — E11649 Type 2 diabetes mellitus with hypoglycemia without coma: Secondary | ICD-10-CM | POA: Diagnosis not present

## 2021-07-23 DIAGNOSIS — E1121 Type 2 diabetes mellitus with diabetic nephropathy: Secondary | ICD-10-CM | POA: Diagnosis not present

## 2021-07-23 DIAGNOSIS — N182 Chronic kidney disease, stage 2 (mild): Secondary | ICD-10-CM | POA: Diagnosis not present

## 2021-07-23 DIAGNOSIS — Z794 Long term (current) use of insulin: Secondary | ICD-10-CM | POA: Diagnosis not present

## 2021-08-06 DIAGNOSIS — C499 Malignant neoplasm of connective and soft tissue, unspecified: Secondary | ICD-10-CM | POA: Diagnosis not present

## 2021-08-06 DIAGNOSIS — Z955 Presence of coronary angioplasty implant and graft: Secondary | ICD-10-CM | POA: Diagnosis not present

## 2021-08-06 DIAGNOSIS — I2510 Atherosclerotic heart disease of native coronary artery without angina pectoris: Secondary | ICD-10-CM | POA: Diagnosis not present

## 2021-08-06 DIAGNOSIS — D171 Benign lipomatous neoplasm of skin and subcutaneous tissue of trunk: Secondary | ICD-10-CM | POA: Diagnosis not present

## 2021-08-20 DIAGNOSIS — E1165 Type 2 diabetes mellitus with hyperglycemia: Secondary | ICD-10-CM | POA: Diagnosis not present

## 2021-08-28 DIAGNOSIS — C499 Malignant neoplasm of connective and soft tissue, unspecified: Secondary | ICD-10-CM | POA: Diagnosis not present

## 2021-08-31 DIAGNOSIS — E782 Mixed hyperlipidemia: Secondary | ICD-10-CM | POA: Diagnosis not present

## 2021-08-31 DIAGNOSIS — I10 Essential (primary) hypertension: Secondary | ICD-10-CM | POA: Diagnosis not present

## 2021-08-31 DIAGNOSIS — E1165 Type 2 diabetes mellitus with hyperglycemia: Secondary | ICD-10-CM | POA: Diagnosis not present

## 2021-08-31 DIAGNOSIS — Z794 Long term (current) use of insulin: Secondary | ICD-10-CM | POA: Diagnosis not present

## 2021-09-17 DIAGNOSIS — D171 Benign lipomatous neoplasm of skin and subcutaneous tissue of trunk: Secondary | ICD-10-CM | POA: Diagnosis not present

## 2021-09-20 DIAGNOSIS — E1165 Type 2 diabetes mellitus with hyperglycemia: Secondary | ICD-10-CM | POA: Diagnosis not present

## 2021-09-21 DIAGNOSIS — E782 Mixed hyperlipidemia: Secondary | ICD-10-CM | POA: Diagnosis not present

## 2021-09-21 DIAGNOSIS — E11649 Type 2 diabetes mellitus with hypoglycemia without coma: Secondary | ICD-10-CM | POA: Diagnosis not present

## 2021-09-21 DIAGNOSIS — N182 Chronic kidney disease, stage 2 (mild): Secondary | ICD-10-CM | POA: Diagnosis not present

## 2021-09-21 DIAGNOSIS — E1121 Type 2 diabetes mellitus with diabetic nephropathy: Secondary | ICD-10-CM | POA: Diagnosis not present

## 2021-09-21 DIAGNOSIS — Z794 Long term (current) use of insulin: Secondary | ICD-10-CM | POA: Diagnosis not present

## 2021-09-28 DIAGNOSIS — E11649 Type 2 diabetes mellitus with hypoglycemia without coma: Secondary | ICD-10-CM | POA: Diagnosis not present

## 2021-09-28 DIAGNOSIS — Z794 Long term (current) use of insulin: Secondary | ICD-10-CM | POA: Diagnosis not present

## 2021-09-28 DIAGNOSIS — E782 Mixed hyperlipidemia: Secondary | ICD-10-CM | POA: Diagnosis not present

## 2021-09-28 DIAGNOSIS — E1121 Type 2 diabetes mellitus with diabetic nephropathy: Secondary | ICD-10-CM | POA: Diagnosis not present

## 2021-09-28 DIAGNOSIS — N182 Chronic kidney disease, stage 2 (mild): Secondary | ICD-10-CM | POA: Diagnosis not present

## 2021-10-20 DIAGNOSIS — E1165 Type 2 diabetes mellitus with hyperglycemia: Secondary | ICD-10-CM | POA: Diagnosis not present

## 2021-11-20 DIAGNOSIS — E1165 Type 2 diabetes mellitus with hyperglycemia: Secondary | ICD-10-CM | POA: Diagnosis not present

## 2021-12-20 DIAGNOSIS — E1165 Type 2 diabetes mellitus with hyperglycemia: Secondary | ICD-10-CM | POA: Diagnosis not present

## 2022-01-20 DIAGNOSIS — E1165 Type 2 diabetes mellitus with hyperglycemia: Secondary | ICD-10-CM | POA: Diagnosis not present

## 2022-02-20 DIAGNOSIS — E1165 Type 2 diabetes mellitus with hyperglycemia: Secondary | ICD-10-CM | POA: Diagnosis not present

## 2022-03-15 DIAGNOSIS — Z794 Long term (current) use of insulin: Secondary | ICD-10-CM | POA: Diagnosis not present

## 2022-03-15 DIAGNOSIS — E1121 Type 2 diabetes mellitus with diabetic nephropathy: Secondary | ICD-10-CM | POA: Diagnosis not present

## 2022-03-15 DIAGNOSIS — E11649 Type 2 diabetes mellitus with hypoglycemia without coma: Secondary | ICD-10-CM | POA: Diagnosis not present

## 2022-03-22 DIAGNOSIS — I10 Essential (primary) hypertension: Secondary | ICD-10-CM | POA: Diagnosis not present

## 2022-03-22 DIAGNOSIS — I2510 Atherosclerotic heart disease of native coronary artery without angina pectoris: Secondary | ICD-10-CM | POA: Diagnosis not present

## 2022-03-22 DIAGNOSIS — E039 Hypothyroidism, unspecified: Secondary | ICD-10-CM | POA: Diagnosis not present

## 2022-03-22 DIAGNOSIS — Z23 Encounter for immunization: Secondary | ICD-10-CM | POA: Diagnosis not present

## 2022-03-22 DIAGNOSIS — E782 Mixed hyperlipidemia: Secondary | ICD-10-CM | POA: Diagnosis not present

## 2022-03-22 DIAGNOSIS — E1165 Type 2 diabetes mellitus with hyperglycemia: Secondary | ICD-10-CM | POA: Diagnosis not present

## 2022-03-22 DIAGNOSIS — E11649 Type 2 diabetes mellitus with hypoglycemia without coma: Secondary | ICD-10-CM | POA: Diagnosis not present

## 2022-03-22 DIAGNOSIS — Z794 Long term (current) use of insulin: Secondary | ICD-10-CM | POA: Diagnosis not present

## 2022-03-22 DIAGNOSIS — N182 Chronic kidney disease, stage 2 (mild): Secondary | ICD-10-CM | POA: Diagnosis not present

## 2022-03-22 DIAGNOSIS — E1121 Type 2 diabetes mellitus with diabetic nephropathy: Secondary | ICD-10-CM | POA: Diagnosis not present

## 2022-03-22 DIAGNOSIS — S98131D Complete traumatic amputation of one right lesser toe, subsequent encounter: Secondary | ICD-10-CM | POA: Diagnosis not present

## 2022-04-22 DIAGNOSIS — E1165 Type 2 diabetes mellitus with hyperglycemia: Secondary | ICD-10-CM | POA: Diagnosis not present

## 2022-05-22 DIAGNOSIS — E1165 Type 2 diabetes mellitus with hyperglycemia: Secondary | ICD-10-CM | POA: Diagnosis not present

## 2022-06-06 DIAGNOSIS — E785 Hyperlipidemia, unspecified: Secondary | ICD-10-CM | POA: Diagnosis not present

## 2022-06-06 DIAGNOSIS — I10 Essential (primary) hypertension: Secondary | ICD-10-CM | POA: Diagnosis not present

## 2022-06-06 DIAGNOSIS — I2510 Atherosclerotic heart disease of native coronary artery without angina pectoris: Secondary | ICD-10-CM | POA: Diagnosis not present

## 2022-06-06 DIAGNOSIS — I252 Old myocardial infarction: Secondary | ICD-10-CM | POA: Diagnosis not present

## 2022-06-22 DIAGNOSIS — E1165 Type 2 diabetes mellitus with hyperglycemia: Secondary | ICD-10-CM | POA: Diagnosis not present

## 2022-07-12 DIAGNOSIS — I10 Essential (primary) hypertension: Secondary | ICD-10-CM | POA: Diagnosis not present

## 2022-07-12 DIAGNOSIS — Z794 Long term (current) use of insulin: Secondary | ICD-10-CM | POA: Diagnosis not present

## 2022-07-12 DIAGNOSIS — E782 Mixed hyperlipidemia: Secondary | ICD-10-CM | POA: Diagnosis not present

## 2022-07-12 DIAGNOSIS — E11649 Type 2 diabetes mellitus with hypoglycemia without coma: Secondary | ICD-10-CM | POA: Diagnosis not present

## 2022-07-12 DIAGNOSIS — R5383 Other fatigue: Secondary | ICD-10-CM | POA: Diagnosis not present

## 2022-07-12 DIAGNOSIS — E1121 Type 2 diabetes mellitus with diabetic nephropathy: Secondary | ICD-10-CM | POA: Diagnosis not present

## 2022-07-12 DIAGNOSIS — I2510 Atherosclerotic heart disease of native coronary artery without angina pectoris: Secondary | ICD-10-CM | POA: Diagnosis not present

## 2022-07-12 DIAGNOSIS — E039 Hypothyroidism, unspecified: Secondary | ICD-10-CM | POA: Diagnosis not present

## 2022-07-12 DIAGNOSIS — S98131D Complete traumatic amputation of one right lesser toe, subsequent encounter: Secondary | ICD-10-CM | POA: Diagnosis not present

## 2022-07-12 DIAGNOSIS — N182 Chronic kidney disease, stage 2 (mild): Secondary | ICD-10-CM | POA: Diagnosis not present

## 2022-07-19 DIAGNOSIS — G4700 Insomnia, unspecified: Secondary | ICD-10-CM | POA: Diagnosis not present

## 2022-07-19 DIAGNOSIS — N182 Chronic kidney disease, stage 2 (mild): Secondary | ICD-10-CM | POA: Diagnosis not present

## 2022-07-19 DIAGNOSIS — Z794 Long term (current) use of insulin: Secondary | ICD-10-CM | POA: Diagnosis not present

## 2022-07-19 DIAGNOSIS — R222 Localized swelling, mass and lump, trunk: Secondary | ICD-10-CM | POA: Diagnosis not present

## 2022-07-19 DIAGNOSIS — E039 Hypothyroidism, unspecified: Secondary | ICD-10-CM | POA: Diagnosis not present

## 2022-07-19 DIAGNOSIS — Z0000 Encounter for general adult medical examination without abnormal findings: Secondary | ICD-10-CM | POA: Diagnosis not present

## 2022-07-19 DIAGNOSIS — E782 Mixed hyperlipidemia: Secondary | ICD-10-CM | POA: Diagnosis not present

## 2022-07-19 DIAGNOSIS — E11649 Type 2 diabetes mellitus with hypoglycemia without coma: Secondary | ICD-10-CM | POA: Diagnosis not present

## 2022-07-19 DIAGNOSIS — E1121 Type 2 diabetes mellitus with diabetic nephropathy: Secondary | ICD-10-CM | POA: Diagnosis not present

## 2022-07-19 DIAGNOSIS — I10 Essential (primary) hypertension: Secondary | ICD-10-CM | POA: Diagnosis not present

## 2022-07-19 DIAGNOSIS — I2510 Atherosclerotic heart disease of native coronary artery without angina pectoris: Secondary | ICD-10-CM | POA: Diagnosis not present

## 2022-07-23 DIAGNOSIS — E1165 Type 2 diabetes mellitus with hyperglycemia: Secondary | ICD-10-CM | POA: Diagnosis not present

## 2022-08-06 DIAGNOSIS — R222 Localized swelling, mass and lump, trunk: Secondary | ICD-10-CM | POA: Diagnosis not present

## 2022-08-06 DIAGNOSIS — I2510 Atherosclerotic heart disease of native coronary artery without angina pectoris: Secondary | ICD-10-CM | POA: Diagnosis not present

## 2022-08-06 DIAGNOSIS — I700 Atherosclerosis of aorta: Secondary | ICD-10-CM | POA: Diagnosis not present

## 2022-08-09 DIAGNOSIS — D179 Benign lipomatous neoplasm, unspecified: Secondary | ICD-10-CM | POA: Diagnosis not present

## 2022-11-28 DIAGNOSIS — E119 Type 2 diabetes mellitus without complications: Secondary | ICD-10-CM | POA: Diagnosis not present

## 2023-01-24 DIAGNOSIS — N182 Chronic kidney disease, stage 2 (mild): Secondary | ICD-10-CM | POA: Diagnosis not present

## 2023-01-24 DIAGNOSIS — E1121 Type 2 diabetes mellitus with diabetic nephropathy: Secondary | ICD-10-CM | POA: Diagnosis not present

## 2023-01-24 DIAGNOSIS — E782 Mixed hyperlipidemia: Secondary | ICD-10-CM | POA: Diagnosis not present

## 2023-01-24 DIAGNOSIS — I10 Essential (primary) hypertension: Secondary | ICD-10-CM | POA: Diagnosis not present

## 2023-01-31 DIAGNOSIS — E039 Hypothyroidism, unspecified: Secondary | ICD-10-CM | POA: Diagnosis not present

## 2023-01-31 DIAGNOSIS — E1122 Type 2 diabetes mellitus with diabetic chronic kidney disease: Secondary | ICD-10-CM | POA: Diagnosis not present

## 2023-01-31 DIAGNOSIS — I2510 Atherosclerotic heart disease of native coronary artery without angina pectoris: Secondary | ICD-10-CM | POA: Diagnosis not present

## 2023-01-31 DIAGNOSIS — S98131D Complete traumatic amputation of one right lesser toe, subsequent encounter: Secondary | ICD-10-CM | POA: Diagnosis not present

## 2023-01-31 DIAGNOSIS — Z794 Long term (current) use of insulin: Secondary | ICD-10-CM | POA: Diagnosis not present

## 2023-01-31 DIAGNOSIS — I10 Essential (primary) hypertension: Secondary | ICD-10-CM | POA: Diagnosis not present

## 2023-01-31 DIAGNOSIS — E782 Mixed hyperlipidemia: Secondary | ICD-10-CM | POA: Diagnosis not present

## 2023-01-31 DIAGNOSIS — N182 Chronic kidney disease, stage 2 (mild): Secondary | ICD-10-CM | POA: Diagnosis not present

## 2023-01-31 DIAGNOSIS — Z23 Encounter for immunization: Secondary | ICD-10-CM | POA: Diagnosis not present

## 2023-01-31 DIAGNOSIS — E11649 Type 2 diabetes mellitus with hypoglycemia without coma: Secondary | ICD-10-CM | POA: Diagnosis not present

## 2023-01-31 DIAGNOSIS — R222 Localized swelling, mass and lump, trunk: Secondary | ICD-10-CM | POA: Diagnosis not present

## 2023-06-10 DIAGNOSIS — I10 Essential (primary) hypertension: Secondary | ICD-10-CM | POA: Diagnosis not present

## 2023-06-10 DIAGNOSIS — I358 Other nonrheumatic aortic valve disorders: Secondary | ICD-10-CM | POA: Diagnosis not present

## 2023-06-10 DIAGNOSIS — R001 Bradycardia, unspecified: Secondary | ICD-10-CM | POA: Diagnosis not present

## 2023-06-10 DIAGNOSIS — E785 Hyperlipidemia, unspecified: Secondary | ICD-10-CM | POA: Diagnosis not present

## 2023-06-10 DIAGNOSIS — Z955 Presence of coronary angioplasty implant and graft: Secondary | ICD-10-CM | POA: Diagnosis not present

## 2023-06-10 DIAGNOSIS — I252 Old myocardial infarction: Secondary | ICD-10-CM | POA: Diagnosis not present

## 2023-06-10 DIAGNOSIS — I2510 Atherosclerotic heart disease of native coronary artery without angina pectoris: Secondary | ICD-10-CM | POA: Diagnosis not present

## 2023-07-28 DIAGNOSIS — E782 Mixed hyperlipidemia: Secondary | ICD-10-CM | POA: Diagnosis not present

## 2023-07-28 DIAGNOSIS — Z794 Long term (current) use of insulin: Secondary | ICD-10-CM | POA: Diagnosis not present

## 2023-07-28 DIAGNOSIS — E1122 Type 2 diabetes mellitus with diabetic chronic kidney disease: Secondary | ICD-10-CM | POA: Diagnosis not present

## 2023-07-28 DIAGNOSIS — N182 Chronic kidney disease, stage 2 (mild): Secondary | ICD-10-CM | POA: Diagnosis not present

## 2023-07-28 DIAGNOSIS — I10 Essential (primary) hypertension: Secondary | ICD-10-CM | POA: Diagnosis not present

## 2023-08-01 DIAGNOSIS — I10 Essential (primary) hypertension: Secondary | ICD-10-CM | POA: Diagnosis not present

## 2023-08-01 DIAGNOSIS — I2510 Atherosclerotic heart disease of native coronary artery without angina pectoris: Secondary | ICD-10-CM | POA: Diagnosis not present

## 2023-08-01 DIAGNOSIS — E039 Hypothyroidism, unspecified: Secondary | ICD-10-CM | POA: Diagnosis not present

## 2023-08-01 DIAGNOSIS — E782 Mixed hyperlipidemia: Secondary | ICD-10-CM | POA: Diagnosis not present

## 2023-08-01 DIAGNOSIS — D171 Benign lipomatous neoplasm of skin and subcutaneous tissue of trunk: Secondary | ICD-10-CM | POA: Diagnosis not present

## 2023-08-01 DIAGNOSIS — Z794 Long term (current) use of insulin: Secondary | ICD-10-CM | POA: Diagnosis not present

## 2023-08-01 DIAGNOSIS — E1122 Type 2 diabetes mellitus with diabetic chronic kidney disease: Secondary | ICD-10-CM | POA: Diagnosis not present

## 2023-08-01 DIAGNOSIS — Z0000 Encounter for general adult medical examination without abnormal findings: Secondary | ICD-10-CM | POA: Diagnosis not present

## 2023-08-01 DIAGNOSIS — D72829 Elevated white blood cell count, unspecified: Secondary | ICD-10-CM | POA: Diagnosis not present

## 2023-08-01 DIAGNOSIS — R1902 Left upper quadrant abdominal swelling, mass and lump: Secondary | ICD-10-CM | POA: Diagnosis not present

## 2023-08-01 DIAGNOSIS — S98131D Complete traumatic amputation of one right lesser toe, subsequent encounter: Secondary | ICD-10-CM | POA: Diagnosis not present

## 2023-08-01 DIAGNOSIS — N182 Chronic kidney disease, stage 2 (mild): Secondary | ICD-10-CM | POA: Diagnosis not present

## 2023-08-06 DIAGNOSIS — R1902 Left upper quadrant abdominal swelling, mass and lump: Secondary | ICD-10-CM | POA: Diagnosis not present

## 2023-08-06 DIAGNOSIS — K7689 Other specified diseases of liver: Secondary | ICD-10-CM | POA: Diagnosis not present

## 2023-09-18 DIAGNOSIS — E782 Mixed hyperlipidemia: Secondary | ICD-10-CM | POA: Diagnosis not present

## 2023-09-18 DIAGNOSIS — N182 Chronic kidney disease, stage 2 (mild): Secondary | ICD-10-CM | POA: Diagnosis not present

## 2023-09-18 DIAGNOSIS — I10 Essential (primary) hypertension: Secondary | ICD-10-CM | POA: Diagnosis not present

## 2023-09-18 DIAGNOSIS — E11649 Type 2 diabetes mellitus with hypoglycemia without coma: Secondary | ICD-10-CM | POA: Diagnosis not present

## 2023-09-25 DIAGNOSIS — E11649 Type 2 diabetes mellitus with hypoglycemia without coma: Secondary | ICD-10-CM | POA: Diagnosis not present

## 2023-09-25 DIAGNOSIS — E039 Hypothyroidism, unspecified: Secondary | ICD-10-CM | POA: Diagnosis not present

## 2023-09-25 DIAGNOSIS — I10 Essential (primary) hypertension: Secondary | ICD-10-CM | POA: Diagnosis not present

## 2023-09-25 DIAGNOSIS — Z794 Long term (current) use of insulin: Secondary | ICD-10-CM | POA: Diagnosis not present

## 2023-09-25 DIAGNOSIS — I2510 Atherosclerotic heart disease of native coronary artery without angina pectoris: Secondary | ICD-10-CM | POA: Diagnosis not present

## 2023-09-25 DIAGNOSIS — R1902 Left upper quadrant abdominal swelling, mass and lump: Secondary | ICD-10-CM | POA: Diagnosis not present

## 2023-09-25 DIAGNOSIS — D171 Benign lipomatous neoplasm of skin and subcutaneous tissue of trunk: Secondary | ICD-10-CM | POA: Diagnosis not present

## 2023-09-25 DIAGNOSIS — E782 Mixed hyperlipidemia: Secondary | ICD-10-CM | POA: Diagnosis not present

## 2023-09-25 DIAGNOSIS — S98131D Complete traumatic amputation of one right lesser toe, subsequent encounter: Secondary | ICD-10-CM | POA: Diagnosis not present

## 2023-09-25 DIAGNOSIS — E1122 Type 2 diabetes mellitus with diabetic chronic kidney disease: Secondary | ICD-10-CM | POA: Diagnosis not present

## 2023-09-25 DIAGNOSIS — D72829 Elevated white blood cell count, unspecified: Secondary | ICD-10-CM | POA: Diagnosis not present

## 2023-10-16 DIAGNOSIS — B372 Candidiasis of skin and nail: Secondary | ICD-10-CM | POA: Diagnosis not present

## 2023-10-16 DIAGNOSIS — L820 Inflamed seborrheic keratosis: Secondary | ICD-10-CM | POA: Diagnosis not present

## 2023-10-16 DIAGNOSIS — L089 Local infection of the skin and subcutaneous tissue, unspecified: Secondary | ICD-10-CM | POA: Diagnosis not present

## 2024-01-14 DIAGNOSIS — R300 Dysuria: Secondary | ICD-10-CM | POA: Diagnosis not present

## 2024-01-14 DIAGNOSIS — M545 Low back pain, unspecified: Secondary | ICD-10-CM | POA: Diagnosis not present

## 2024-03-04 DIAGNOSIS — N182 Chronic kidney disease, stage 2 (mild): Secondary | ICD-10-CM | POA: Diagnosis not present

## 2024-03-04 DIAGNOSIS — E1122 Type 2 diabetes mellitus with diabetic chronic kidney disease: Secondary | ICD-10-CM | POA: Diagnosis not present

## 2024-03-04 DIAGNOSIS — Z794 Long term (current) use of insulin: Secondary | ICD-10-CM | POA: Diagnosis not present

## 2024-03-04 DIAGNOSIS — S98131D Complete traumatic amputation of one right lesser toe, subsequent encounter: Secondary | ICD-10-CM | POA: Diagnosis not present

## 2024-03-11 DIAGNOSIS — Z23 Encounter for immunization: Secondary | ICD-10-CM | POA: Diagnosis not present

## 2024-03-11 DIAGNOSIS — D72829 Elevated white blood cell count, unspecified: Secondary | ICD-10-CM | POA: Diagnosis not present

## 2024-03-11 DIAGNOSIS — N182 Chronic kidney disease, stage 2 (mild): Secondary | ICD-10-CM | POA: Diagnosis not present

## 2024-03-11 DIAGNOSIS — E1122 Type 2 diabetes mellitus with diabetic chronic kidney disease: Secondary | ICD-10-CM | POA: Diagnosis not present

## 2024-03-11 DIAGNOSIS — I2510 Atherosclerotic heart disease of native coronary artery without angina pectoris: Secondary | ICD-10-CM | POA: Diagnosis not present

## 2024-03-11 DIAGNOSIS — Z794 Long term (current) use of insulin: Secondary | ICD-10-CM | POA: Diagnosis not present

## 2024-03-11 DIAGNOSIS — S98131D Complete traumatic amputation of one right lesser toe, subsequent encounter: Secondary | ICD-10-CM | POA: Diagnosis not present

## 2024-03-11 DIAGNOSIS — E039 Hypothyroidism, unspecified: Secondary | ICD-10-CM | POA: Diagnosis not present

## 2024-03-11 DIAGNOSIS — I10 Essential (primary) hypertension: Secondary | ICD-10-CM | POA: Diagnosis not present

## 2024-03-11 DIAGNOSIS — R1902 Left upper quadrant abdominal swelling, mass and lump: Secondary | ICD-10-CM | POA: Diagnosis not present

## 2024-03-11 DIAGNOSIS — E782 Mixed hyperlipidemia: Secondary | ICD-10-CM | POA: Diagnosis not present
